# Patient Record
Sex: Female | Born: 1970 | Race: White | Hispanic: No | Marital: Single | Smoking: Never smoker
Health system: Southern US, Community
[De-identification: ages and names within clinical notes are randomized; demographics above are authoritative.]

## PROBLEM LIST (undated history)

## (undated) DIAGNOSIS — D099 Carcinoma in situ, unspecified: Secondary | ICD-10-CM

## (undated) DIAGNOSIS — D6800 Von Willebrand disease, unspecified: Secondary | ICD-10-CM

## (undated) DIAGNOSIS — I059 Rheumatic mitral valve disease, unspecified: Secondary | ICD-10-CM

## (undated) DIAGNOSIS — Z9889 Other specified postprocedural states: Secondary | ICD-10-CM

## (undated) DIAGNOSIS — R002 Palpitations: Secondary | ICD-10-CM

## (undated) DIAGNOSIS — G43909 Migraine, unspecified, not intractable, without status migrainosus: Secondary | ICD-10-CM

## (undated) DIAGNOSIS — K259 Gastric ulcer, unspecified as acute or chronic, without hemorrhage or perforation: Secondary | ICD-10-CM

## (undated) DIAGNOSIS — E559 Vitamin D deficiency, unspecified: Secondary | ICD-10-CM

## (undated) DIAGNOSIS — D6851 Activated protein C resistance: Secondary | ICD-10-CM

## (undated) DIAGNOSIS — Z8489 Family history of other specified conditions: Secondary | ICD-10-CM

## (undated) DIAGNOSIS — I82409 Acute embolism and thrombosis of unspecified deep veins of unspecified lower extremity: Secondary | ICD-10-CM

## (undated) DIAGNOSIS — R12 Heartburn: Secondary | ICD-10-CM

## (undated) DIAGNOSIS — R112 Nausea with vomiting, unspecified: Secondary | ICD-10-CM

## (undated) DIAGNOSIS — N979 Female infertility, unspecified: Secondary | ICD-10-CM

## (undated) DIAGNOSIS — Z780 Asymptomatic menopausal state: Secondary | ICD-10-CM

## (undated) DIAGNOSIS — F419 Anxiety disorder, unspecified: Secondary | ICD-10-CM

## (undated) DIAGNOSIS — Z86718 Personal history of other venous thrombosis and embolism: Secondary | ICD-10-CM

## (undated) DIAGNOSIS — I080 Rheumatic disorders of both mitral and aortic valves: Secondary | ICD-10-CM

## (undated) HISTORY — DX: Rheumatic mitral valve disease, unspecified: I05.9

## (undated) HISTORY — DX: Palpitations: R00.2

## (undated) HISTORY — DX: Activated protein C resistance: D68.51

## (undated) HISTORY — DX: Asymptomatic menopausal state: Z78.0

## (undated) HISTORY — DX: Anxiety disorder, unspecified: F41.9

## (undated) HISTORY — DX: Heartburn: R12

## (undated) HISTORY — DX: Von Willebrand's disease: D68.0

## (undated) HISTORY — DX: Female infertility, unspecified: N97.9

## (undated) HISTORY — PX: MANDIBLE FRACTURE SURGERY: SHX706

## (undated) HISTORY — DX: Migraine, unspecified, not intractable, without status migrainosus: G43.909

## (undated) HISTORY — DX: Vitamin D deficiency, unspecified: E55.9

## (undated) HISTORY — DX: Rheumatic disorders of both mitral and aortic valves: I08.0

## (undated) HISTORY — DX: Carcinoma in situ, unspecified: D09.9

## (undated) HISTORY — DX: Gastric ulcer, unspecified as acute or chronic, without hemorrhage or perforation: K25.9

## (undated) HISTORY — DX: Von Willebrand disease, unspecified: D68.00

## (undated) HISTORY — DX: Acute embolism and thrombosis of unspecified deep veins of unspecified lower extremity: I82.409

---

## 1999-11-07 ENCOUNTER — Encounter: Admission: RE | Admit: 1999-11-07 | Discharge: 1999-11-07 | Payer: Self-pay | Admitting: Oral Surgery

## 1999-11-07 ENCOUNTER — Encounter: Payer: Self-pay | Admitting: Oral Surgery

## 1999-11-10 ENCOUNTER — Ambulatory Visit (HOSPITAL_BASED_OUTPATIENT_CLINIC_OR_DEPARTMENT_OTHER): Admission: RE | Admit: 1999-11-10 | Discharge: 1999-11-11 | Payer: Self-pay | Admitting: Oral Surgery

## 2000-04-02 ENCOUNTER — Other Ambulatory Visit: Admission: RE | Admit: 2000-04-02 | Discharge: 2000-04-02 | Payer: Self-pay | Admitting: Obstetrics and Gynecology

## 2000-06-24 ENCOUNTER — Other Ambulatory Visit: Admission: RE | Admit: 2000-06-24 | Discharge: 2000-06-24 | Payer: Self-pay | Admitting: Obstetrics and Gynecology

## 2002-01-11 ENCOUNTER — Inpatient Hospital Stay (HOSPITAL_COMMUNITY): Admission: AD | Admit: 2002-01-11 | Discharge: 2002-01-13 | Payer: Self-pay | Admitting: Internal Medicine

## 2004-01-18 ENCOUNTER — Other Ambulatory Visit: Admission: RE | Admit: 2004-01-18 | Discharge: 2004-01-18 | Payer: Self-pay | Admitting: Family Medicine

## 2004-12-25 ENCOUNTER — Ambulatory Visit: Payer: Self-pay | Admitting: Cardiology

## 2005-05-20 ENCOUNTER — Ambulatory Visit (HOSPITAL_COMMUNITY): Admission: RE | Admit: 2005-05-20 | Discharge: 2005-05-20 | Payer: Self-pay | Admitting: Family Medicine

## 2005-05-25 ENCOUNTER — Ambulatory Visit: Payer: Self-pay | Admitting: Obstetrics & Gynecology

## 2005-05-25 ENCOUNTER — Encounter (INDEPENDENT_AMBULATORY_CARE_PROVIDER_SITE_OTHER): Payer: Self-pay | Admitting: *Deleted

## 2005-05-25 ENCOUNTER — Ambulatory Visit: Payer: Self-pay | Admitting: Hematology & Oncology

## 2005-05-27 ENCOUNTER — Ambulatory Visit (HOSPITAL_COMMUNITY): Admission: RE | Admit: 2005-05-27 | Discharge: 2005-05-27 | Payer: Self-pay | Admitting: *Deleted

## 2005-06-01 ENCOUNTER — Ambulatory Visit: Payer: Self-pay | Admitting: *Deleted

## 2005-06-01 ENCOUNTER — Inpatient Hospital Stay (HOSPITAL_COMMUNITY): Admission: AD | Admit: 2005-06-01 | Discharge: 2005-06-01 | Payer: Self-pay | Admitting: *Deleted

## 2005-06-05 ENCOUNTER — Inpatient Hospital Stay (HOSPITAL_COMMUNITY): Admission: AD | Admit: 2005-06-05 | Discharge: 2005-06-05 | Payer: Self-pay | Admitting: *Deleted

## 2005-06-24 ENCOUNTER — Ambulatory Visit: Payer: Self-pay | Admitting: Obstetrics & Gynecology

## 2005-06-28 ENCOUNTER — Emergency Department (HOSPITAL_COMMUNITY): Admission: EM | Admit: 2005-06-28 | Discharge: 2005-06-28 | Payer: Self-pay | Admitting: Emergency Medicine

## 2005-08-05 ENCOUNTER — Ambulatory Visit: Payer: Self-pay | Admitting: Hematology & Oncology

## 2005-08-05 LAB — IVY BLEEDING TIME
Bleeding Time: 12 Minutes — ABNORMAL HIGH (ref 2.0–8.0)
Bleeding Time: 7 min (ref 2.0–8.0)

## 2005-08-06 ENCOUNTER — Ambulatory Visit: Payer: Self-pay | Admitting: Family Medicine

## 2005-08-06 ENCOUNTER — Other Ambulatory Visit: Admission: RE | Admit: 2005-08-06 | Discharge: 2005-08-06 | Payer: Self-pay | Admitting: Family Medicine

## 2005-08-06 ENCOUNTER — Encounter (INDEPENDENT_AMBULATORY_CARE_PROVIDER_SITE_OTHER): Payer: Self-pay | Admitting: *Deleted

## 2005-09-09 ENCOUNTER — Ambulatory Visit: Payer: Self-pay | Admitting: Obstetrics & Gynecology

## 2006-02-10 ENCOUNTER — Ambulatory Visit: Payer: Self-pay | Admitting: Cardiology

## 2007-08-31 ENCOUNTER — Other Ambulatory Visit: Admission: RE | Admit: 2007-08-31 | Discharge: 2007-08-31 | Payer: Self-pay | Admitting: Family Medicine

## 2007-10-10 ENCOUNTER — Ambulatory Visit: Payer: Self-pay | Admitting: Cardiology

## 2007-10-11 ENCOUNTER — Encounter: Payer: Self-pay | Admitting: Cardiology

## 2007-10-11 ENCOUNTER — Ambulatory Visit: Payer: Self-pay

## 2008-08-08 ENCOUNTER — Encounter (INDEPENDENT_AMBULATORY_CARE_PROVIDER_SITE_OTHER): Payer: Self-pay | Admitting: *Deleted

## 2009-03-06 DIAGNOSIS — G43909 Migraine, unspecified, not intractable, without status migrainosus: Secondary | ICD-10-CM | POA: Insufficient documentation

## 2009-03-06 DIAGNOSIS — R079 Chest pain, unspecified: Secondary | ICD-10-CM | POA: Insufficient documentation

## 2009-03-06 DIAGNOSIS — Z8679 Personal history of other diseases of the circulatory system: Secondary | ICD-10-CM | POA: Insufficient documentation

## 2009-03-06 DIAGNOSIS — R0602 Shortness of breath: Secondary | ICD-10-CM | POA: Insufficient documentation

## 2009-03-06 DIAGNOSIS — Z87898 Personal history of other specified conditions: Secondary | ICD-10-CM

## 2009-03-06 DIAGNOSIS — I080 Rheumatic disorders of both mitral and aortic valves: Secondary | ICD-10-CM | POA: Insufficient documentation

## 2009-03-06 DIAGNOSIS — I059 Rheumatic mitral valve disease, unspecified: Secondary | ICD-10-CM | POA: Insufficient documentation

## 2009-03-12 ENCOUNTER — Ambulatory Visit: Payer: Self-pay | Admitting: Cardiology

## 2009-03-12 ENCOUNTER — Encounter: Payer: Self-pay | Admitting: Cardiology

## 2010-04-02 ENCOUNTER — Ambulatory Visit
Admission: RE | Admit: 2010-04-02 | Discharge: 2010-04-02 | Payer: Self-pay | Source: Home / Self Care | Attending: Internal Medicine | Admitting: Internal Medicine

## 2010-04-11 ENCOUNTER — Encounter
Admission: RE | Admit: 2010-04-11 | Discharge: 2010-04-11 | Payer: Self-pay | Source: Home / Self Care | Attending: Internal Medicine | Admitting: Internal Medicine

## 2010-04-13 ENCOUNTER — Encounter: Payer: Self-pay | Admitting: *Deleted

## 2010-04-23 ENCOUNTER — Encounter: Payer: Self-pay | Admitting: Internal Medicine

## 2010-04-30 ENCOUNTER — Ambulatory Visit (INDEPENDENT_AMBULATORY_CARE_PROVIDER_SITE_OTHER): Payer: 59 | Admitting: Internal Medicine

## 2010-04-30 DIAGNOSIS — F4323 Adjustment disorder with mixed anxiety and depressed mood: Secondary | ICD-10-CM

## 2010-05-14 ENCOUNTER — Ambulatory Visit (HOSPITAL_COMMUNITY): Payer: 59 | Admitting: Marriage and Family Therapist

## 2010-05-14 DIAGNOSIS — F410 Panic disorder [episodic paroxysmal anxiety] without agoraphobia: Secondary | ICD-10-CM

## 2010-05-21 ENCOUNTER — Encounter (HOSPITAL_COMMUNITY): Payer: 59 | Admitting: Marriage and Family Therapist

## 2010-05-21 DIAGNOSIS — F410 Panic disorder [episodic paroxysmal anxiety] without agoraphobia: Secondary | ICD-10-CM

## 2010-05-28 ENCOUNTER — Encounter (HOSPITAL_COMMUNITY): Payer: 59 | Admitting: Marriage and Family Therapist

## 2010-05-28 DIAGNOSIS — F339 Major depressive disorder, recurrent, unspecified: Secondary | ICD-10-CM

## 2010-05-29 ENCOUNTER — Ambulatory Visit (INDEPENDENT_AMBULATORY_CARE_PROVIDER_SITE_OTHER): Payer: 59 | Admitting: Internal Medicine

## 2010-05-29 DIAGNOSIS — F909 Attention-deficit hyperactivity disorder, unspecified type: Secondary | ICD-10-CM

## 2010-05-29 DIAGNOSIS — R1084 Generalized abdominal pain: Secondary | ICD-10-CM

## 2010-06-04 ENCOUNTER — Encounter (HOSPITAL_COMMUNITY): Payer: 59 | Admitting: Marriage and Family Therapist

## 2010-06-04 DIAGNOSIS — F410 Panic disorder [episodic paroxysmal anxiety] without agoraphobia: Secondary | ICD-10-CM

## 2010-06-04 DIAGNOSIS — F339 Major depressive disorder, recurrent, unspecified: Secondary | ICD-10-CM

## 2010-06-09 ENCOUNTER — Encounter (HOSPITAL_COMMUNITY): Payer: 59 | Admitting: Marriage and Family Therapist

## 2010-06-09 DIAGNOSIS — F339 Major depressive disorder, recurrent, unspecified: Secondary | ICD-10-CM

## 2010-06-09 DIAGNOSIS — F410 Panic disorder [episodic paroxysmal anxiety] without agoraphobia: Secondary | ICD-10-CM

## 2010-06-10 ENCOUNTER — Encounter: Payer: Self-pay | Admitting: Cardiology

## 2010-06-11 ENCOUNTER — Encounter (HOSPITAL_COMMUNITY): Payer: 59 | Admitting: Marriage and Family Therapist

## 2010-06-12 ENCOUNTER — Ambulatory Visit: Payer: 59 | Admitting: Internal Medicine

## 2010-06-18 ENCOUNTER — Encounter (HOSPITAL_COMMUNITY): Payer: 59 | Admitting: Marriage and Family Therapist

## 2010-06-19 ENCOUNTER — Ambulatory Visit: Payer: 59 | Admitting: Internal Medicine

## 2010-06-19 DIAGNOSIS — R4589 Other symptoms and signs involving emotional state: Secondary | ICD-10-CM

## 2010-06-20 ENCOUNTER — Encounter: Payer: Self-pay | Admitting: Cardiology

## 2010-06-20 ENCOUNTER — Ambulatory Visit (INDEPENDENT_AMBULATORY_CARE_PROVIDER_SITE_OTHER): Payer: 59 | Admitting: Cardiology

## 2010-06-20 VITALS — BP 110/68 | HR 68 | Resp 16 | Ht 70.0 in | Wt 170.0 lb

## 2010-06-20 DIAGNOSIS — I059 Rheumatic mitral valve disease, unspecified: Secondary | ICD-10-CM

## 2010-06-20 NOTE — Patient Instructions (Signed)
Your physician recommends that you schedule a follow-up appointment in:  In 2 years with Dr. Daleen Squibb

## 2010-06-20 NOTE — Progress Notes (Signed)
   Patient ID: JENEAL VOGL, female    DOB: [birth year], 40 y.o.   MRN: [id number]  HPI  Ms Reilley Valentine returns for E and M of her MVP. She has no complaints including no palpitations, CP, or syncope. She is on no cardiac meds. EKG is normal today.    Review of Systems  [all other systems reviewed and are negative      Physical Exam  Constitutional: She is oriented to person, place, and time. She appears well-developed and well-nourished. No distress.  HENT:  Head: Normocephalic and atraumatic.  Eyes: EOM are normal. Pupils are equal, round, and reactive to light.  Neck: Normal range of motion. Neck supple. No JVD present. No tracheal deviation present. No thyromegaly present.  Cardiovascular: Normal rate, regular rhythm, normal heart sounds and intact distal pulses.   No murmur heard.      No definite click.  Pulmonary/Chest: Effort normal and breath sounds normal.  Abdominal: Soft. Bowel sounds are normal.  Musculoskeletal: Normal range of motion. She exhibits no edema.  Neurological: She is alert and oriented to person, place, and time.  Skin: Skin is warm and dry.  Psychiatric: She has a normal mood and affect.

## 2010-07-02 ENCOUNTER — Ambulatory Visit: Payer: 59 | Admitting: Internal Medicine

## 2010-07-03 ENCOUNTER — Encounter (HOSPITAL_COMMUNITY): Payer: 59 | Admitting: Marriage and Family Therapist

## 2010-07-03 DIAGNOSIS — F339 Major depressive disorder, recurrent, unspecified: Secondary | ICD-10-CM

## 2010-07-03 DIAGNOSIS — F410 Panic disorder [episodic paroxysmal anxiety] without agoraphobia: Secondary | ICD-10-CM

## 2010-07-08 ENCOUNTER — Encounter (HOSPITAL_COMMUNITY): Payer: 59 | Admitting: Marriage and Family Therapist

## 2010-07-16 ENCOUNTER — Encounter (HOSPITAL_COMMUNITY): Payer: 59 | Admitting: Marriage and Family Therapist

## 2010-07-16 DIAGNOSIS — F410 Panic disorder [episodic paroxysmal anxiety] without agoraphobia: Secondary | ICD-10-CM

## 2010-07-16 DIAGNOSIS — F339 Major depressive disorder, recurrent, unspecified: Secondary | ICD-10-CM

## 2010-07-23 ENCOUNTER — Encounter (HOSPITAL_COMMUNITY): Payer: 59 | Admitting: Marriage and Family Therapist

## 2010-07-23 DIAGNOSIS — F410 Panic disorder [episodic paroxysmal anxiety] without agoraphobia: Secondary | ICD-10-CM

## 2010-07-23 DIAGNOSIS — F339 Major depressive disorder, recurrent, unspecified: Secondary | ICD-10-CM

## 2010-07-30 ENCOUNTER — Encounter (HOSPITAL_COMMUNITY): Payer: 59 | Admitting: Marriage and Family Therapist

## 2010-07-30 DIAGNOSIS — F410 Panic disorder [episodic paroxysmal anxiety] without agoraphobia: Secondary | ICD-10-CM

## 2010-07-30 DIAGNOSIS — F339 Major depressive disorder, recurrent, unspecified: Secondary | ICD-10-CM

## 2010-08-05 NOTE — Assessment & Plan Note (Signed)
[facility]                            CARDIOLOGY OFFICE NOTE   NAME:Pamela [name], Pamela [name]                    MRN:          [id number]  DATE:10/10/2007                            DOB:          09-12-1970    Ms. Pamela [name] returns today for further management of her mitral valve  prolapse and history of palpitations.  It has been several years since  we have seen here.   She has been hiking quite a bit at [employer] and has lost about 18  pounds.  She looks remarkably good.  She denies any palpitations and has  had some mild shortness of breath and lightheadedness with exertion.   She says she stays well hydrated.  She denies any chest pain.  She had  no orthopnea, PND, syncope, or presyncope.   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   She is on no medications.   PHYSICAL EXAMINATION:  VITAL SIGNS:  Today, her blood pressure is  114/78, her pulse is 68 and regular, and her weight is 180.  HEENT:  Normocephalic and atraumatic.  PERRL.  Extraocular movements are  intact.  Sclerae are clear.  Facial symmetry is normal.  NECK:  Carotid upstrokes are equal bilaterally without bruits.  No JVD.  Thyroid is not palpable.  Trachea is midline.  LUNGS:  Clear.  HEART:  A nondisplaced, poorly appreciated PMI.  She has normal S1 and  S2.  There is a very soft murmur at the apex.  No click.  ABDOMEN:  Soft.  Good bowel sounds.  No midline bruits.  EXTREMITIES:  There is no cyanosis, clubbing, or edema.  Pulses are  intact.  NEURO:  Intact.   Electrocardiogram demonstrates sinus rhythm with nonspecific ST-segment  changes.   ASSESSMENT:  1. Mitral valve prolapse with a history of mild mitral regurgitation.  2. Tachy palpitations, which are fairly quiescent.  3. Exertional shortness of breath and lightheadedness.   PLAN:  1. A 2-D echocardiogram to reassess LV function, degree of mitral      valve prolapse, mitral  regurgitation, and right-sided pressures.  2. Continue weight reduction.  3. Stay well hydrated.  4. No subacute bacterial endocarditis prophylaxis recommended.   I will plan on seeing her back again in a year.     Thomas C. Daleen Squibb, MD, [facility]  Electronically Signed    TCW/MedQ  DD: 10/10/2007  DT: 10/11/2007  Job #: [id number]

## 2010-08-06 ENCOUNTER — Encounter: Payer: Self-pay | Admitting: Internal Medicine

## 2010-08-07 ENCOUNTER — Ambulatory Visit (INDEPENDENT_AMBULATORY_CARE_PROVIDER_SITE_OTHER): Payer: 59 | Admitting: Internal Medicine

## 2010-08-07 DIAGNOSIS — F4323 Adjustment disorder with mixed anxiety and depressed mood: Secondary | ICD-10-CM

## 2010-08-07 DIAGNOSIS — R51 Headache: Secondary | ICD-10-CM

## 2010-08-08 NOTE — Discharge Summary (Signed)
   NAME:  KOLEEN, CELIA                      ACCOUNT NO.:  [id number]   MEDICAL RECORD NO.:  [id number]                   PATIENT TYPE:  INP   LOCATION:  0374                                 FACILITY:  [facility]   PHYSICIAN:  Sherin Quarry, MD                   DATE OF BIRTH:  03/13/1971   DATE OF ADMISSION:  01/11/2002  DATE OF DISCHARGE:  01/13/2002                                 DISCHARGE SUMMARY   HISTORY OF PRESENT ILLNESS:  The patient is a 40 year old lady who began  birth control pills in May.  She states that she began to experience pain in  her calf about five days prior to admission on 01/11/02.  She also noted the  onset of an area of linear redness on the calf.  She saw Dr. Janey Greaser in the  walk-in clinic on the night prior to admission, and concern was raised about  possible deep vein thrombosis.  She was given 100 mg of Lovenox x1 in the  walk-in clinic and then was sent for a venous Doppler at [facility] which showed a deep vein thrombosis in the patellar and calf veins.  She was advised to come to the hospital for treatment.   PHYSICAL EXAMINATION:  Remarkable only for examination of her right calf  which showed erythema and linear streak posteriorly in the calf.   HOSPITAL COURSE:  On admission, she was begun on Lovenox at a dose of 70 mg  subcutaneously q.12h., and a weight based protocol.  She was also given  Coumadin 10 mg initially, and then 10 mg the next day, and 7.5 mg the next  day.  Her INR was 1.5 at the time of her discharge.  Her mother was  instructed in administration of Lovenox.  On 01/13/02, was felt reasonable  to discharge the patient.   DISCHARGE DIAGNOSIS:  Deep vein thrombophlebitis.   DISCHARGE MEDICATIONS:  1. Lovenox 60 mg subcutaneously q.12h. Friday, Saturday, and Sunday.  2. Coumadin 7.5 mg Friday, 7.5 mg Saturday, and 5 mg on Sunday.  3. Darvocet p.r.n. pain.   FOLLOWUP:  1. She was instructed to go to [facility] on Monday     for followup prothrombin time and INR.  2. She was advised to have a return appointment with Dr. Doran Clay in     five to seven days.                                               Sherin Quarry, MD    SY/MEDQ  D:  01/13/2002  T:  01/15/2002  Job:  [id number]   cc:   Al Decant. Janey Greaser, M.D.  [address]  [place]  [place] [address]  Fax: [phone]

## 2010-08-08 NOTE — Group Therapy Note (Signed)
NAME:  [name], Pamela             ACCOUNT NO.:  [id number]   MEDICAL RECORD NO.:  [id number]          PATIENT TYPE:  WOC   LOCATION:  [facility]                   FACILITY:  [facility]   PHYSICIAN:  Elsie Lincoln, MD      DATE OF BIRTH:  05-30-1970   DATE OF SERVICE:  06/24/2005                                    CLINIC NOTE   The patient is a 40 year old female who presents to followup miscarriage.  She had an early first trimester miscarriage spontaneously and has stopped  bleeding since then. She has occasional spotting but nothing heavy. She is  no longer cramping. She was seen in our high risk clinic secondary to factor  V Leiden positive but also I sent her to a hematologist because she has a  history of bleeding heavily in oral surgery. They did do a bleeding time  which was 12 minutes and they drew more labs which I am understand is a  workup for Von Willebrand disease. She has not had the results of those back  but she will get them this week and we will hopefully receive a copy of the  consult. 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 She needs a colposcopy. She is no longer sexually  active as she is getting divorced from her husband. She is simply doing well  with no signs of depression. We will follow her up with colposcopy in 6-8  weeks after getting the note from the hematologist to make sure that she  does not need any desmopressin prior to the procedure.           ______________________________  Elsie Lincoln, MD     KL/MEDQ  D:  06/24/2005  T:  06/25/2005  Job:  [id number]

## 2010-08-14 ENCOUNTER — Ambulatory Visit: Payer: 59 | Admitting: Internal Medicine

## 2010-08-20 ENCOUNTER — Encounter (HOSPITAL_COMMUNITY): Payer: 59 | Admitting: Marriage and Family Therapist

## 2010-08-20 DIAGNOSIS — F410 Panic disorder [episodic paroxysmal anxiety] without agoraphobia: Secondary | ICD-10-CM

## 2010-08-20 DIAGNOSIS — F339 Major depressive disorder, recurrent, unspecified: Secondary | ICD-10-CM

## 2010-08-27 ENCOUNTER — Encounter (HOSPITAL_COMMUNITY): Payer: 59 | Admitting: Marriage and Family Therapist

## 2010-09-03 ENCOUNTER — Encounter (HOSPITAL_COMMUNITY): Payer: 59 | Admitting: Marriage and Family Therapist

## 2010-09-10 ENCOUNTER — Encounter (HOSPITAL_COMMUNITY): Payer: 59 | Admitting: Marriage and Family Therapist

## 2010-09-10 DIAGNOSIS — F339 Major depressive disorder, recurrent, unspecified: Secondary | ICD-10-CM

## 2010-09-10 DIAGNOSIS — F410 Panic disorder [episodic paroxysmal anxiety] without agoraphobia: Secondary | ICD-10-CM

## 2010-09-29 ENCOUNTER — Encounter (HOSPITAL_COMMUNITY): Payer: 59 | Admitting: Marriage and Family Therapist

## 2010-09-29 DIAGNOSIS — F410 Panic disorder [episodic paroxysmal anxiety] without agoraphobia: Secondary | ICD-10-CM

## 2010-09-29 DIAGNOSIS — F329 Major depressive disorder, single episode, unspecified: Secondary | ICD-10-CM

## 2010-10-07 ENCOUNTER — Encounter (HOSPITAL_COMMUNITY): Payer: 59 | Admitting: Marriage and Family Therapist

## 2010-10-13 ENCOUNTER — Encounter (HOSPITAL_COMMUNITY): Payer: 59 | Admitting: Marriage and Family Therapist

## 2010-10-20 ENCOUNTER — Ambulatory Visit (INDEPENDENT_AMBULATORY_CARE_PROVIDER_SITE_OTHER): Payer: 59 | Admitting: Internal Medicine

## 2010-10-20 ENCOUNTER — Encounter: Payer: Self-pay | Admitting: Internal Medicine

## 2010-10-20 VITALS — BP 116/72 | HR 75 | Temp 97.5°F | Ht 69.5 in | Wt 175.0 lb

## 2010-10-20 DIAGNOSIS — I341 Nonrheumatic mitral (valve) prolapse: Secondary | ICD-10-CM

## 2010-10-20 DIAGNOSIS — I824Z9 Acute embolism and thrombosis of unspecified deep veins of unspecified distal lower extremity: Secondary | ICD-10-CM

## 2010-10-20 DIAGNOSIS — Z111 Encounter for screening for respiratory tuberculosis: Secondary | ICD-10-CM

## 2010-10-20 DIAGNOSIS — D6851 Activated protein C resistance: Secondary | ICD-10-CM | POA: Insufficient documentation

## 2010-10-20 DIAGNOSIS — I82409 Acute embolism and thrombosis of unspecified deep veins of unspecified lower extremity: Secondary | ICD-10-CM | POA: Insufficient documentation

## 2010-10-20 DIAGNOSIS — G43909 Migraine, unspecified, not intractable, without status migrainosus: Secondary | ICD-10-CM | POA: Insufficient documentation

## 2010-10-20 DIAGNOSIS — F4322 Adjustment disorder with anxiety: Secondary | ICD-10-CM

## 2010-10-20 DIAGNOSIS — D680 Von Willebrand's disease: Secondary | ICD-10-CM | POA: Insufficient documentation

## 2010-10-20 DIAGNOSIS — I059 Rheumatic mitral valve disease, unspecified: Secondary | ICD-10-CM

## 2010-10-20 DIAGNOSIS — D6859 Other primary thrombophilia: Secondary | ICD-10-CM

## 2010-10-20 NOTE — Patient Instructions (Signed)
Next visit with Dr. Daleen Squibb discuss with him possiblility of ECHO  Call as needed

## 2010-10-20 NOTE — Progress Notes (Signed)
Subjective:    Patient ID: Pamela [name], female    DOB: 05-08-1970, 40 y.o.   MRN: [id number]  HPI  Pamela [name] is here to follow up on her adjustment disorder with anxiety and for TB skin test.    She is doing well.  She is contemplating adopting a foster child.  She has considered this for quite some time now and has already initiated classes with an agency.  She has been a guardian ad litem in the past and is aware of some of the responsibilities of raising a foster child .  She wishes to have a teen-age boy and has met some of the children already.  Still has some job stress over continuing her current job.  Her anxiety is much improved and she is off all  meds now.  She did see Dr. Donell Beers and has reduced her appointments with [facility] her therapist.  Appetite good.  Sleeping much improved.  She has not had any chest pain or palpitations  No Known Allergies Past Medical History  Diagnosis Date  . Mitral valve disorders     with regurgitation  . Mitral valve insufficiency and aortic valve insufficiency   . Palpitations   . Migraine headache   . Factor V Leiden   . DVT (deep venous thrombosis)   . Von Willebrand disease   . Anxiety     Adjustment disorder with anxious/depressed mood   Past Surgical History  Procedure Date  . Mandible fracture surgery    History   Social History  . Marital Status: Legally Separated    Spouse Name: N/A    Number of Children: N/A  . Years of Education: college   Occupational History  . ACCOUNTANT    Social History Main Topics  . Smoking status: Never Smoker   . Smokeless tobacco: Never Used  . Alcohol Use: 1.0 - 1.5 oz/week    2-3 drink(s) per week  . Drug Use: No  . Sexually Active: Yes -- Female partner(s)   Other Topics Concern  . Not on file   Social History Narrative  . No narrative on file  S Family History  Problem Relation Age of Onset  . Mitral valve prolapse Mother   . Heart disease Mother   . Mitral valve prolapse  Maternal Grandfather   . Stroke Maternal Grandfather   . Cancer Maternal Grandmother     lung   Patient Active Problem List  Diagnoses  . MITRAL REGURGITATION, MILD  . MITRAL VALVE PROLAPSE  . PALPITATIONS, HX OF  . MIGRAINES, HX OF  . Factor V Leiden  . DVT (deep venous thrombosis)  . Migraine headache  . Von Willebrand disease   Current Outpatient Prescriptions on File Prior to Visit  Medication Sig Dispense Refill  . buPROPion (WELLBUTRIN SR) 150 MG 12 hr tablet Take 150 mg by mouth 2 (two) times daily.        . clonazePAM (KLONOPIN) 0.5 MG tablet Take 0.25 mg by mouth daily as needed.             Review of Systems See HPI     Objective:   Physical Exam Physical Exam  Nursing note and vitals reviewed.  Constitutional: She is oriented to person, place, and time. She appears well-developed and well-nourished.  HENT:  Head: Normocephalic and atraumatic.  Cardiovascular: Normal rate and regular rhythm. Exam reveals no gallop and no friction rub.  No murmur heard.  No distinct click is heard Pulmonary/Chest: Breath sounds  normal. She has no wheezes. She has no rales.  Neurological: She is alert and oriented to person, place, and time.  Skin: Skin is warm and dry.  Psychiatric: She has a normal mood and affect. Her behavior is normal.             Assessment & Plan:   1)  Adjustment disorder with anxiety -  Much improved off meds now.  Condition stable  2)  Need for TB skin test  Will administer today and to be read on Thursday. Form filled out for foster adoption  3)  H/O MVP with regurg.  See Dr. Vern Claude last note.  Visits decreased to q2years.  Advised pt to discuss evaluation with repeat ECHO at her next visit with Dr. Daleen Squibb

## 2011-01-12 ENCOUNTER — Encounter: Payer: Self-pay | Admitting: Internal Medicine

## 2011-01-12 ENCOUNTER — Ambulatory Visit (INDEPENDENT_AMBULATORY_CARE_PROVIDER_SITE_OTHER): Payer: 59 | Admitting: Internal Medicine

## 2011-01-12 VITALS — BP 116/76 | HR 78 | Temp 97.1°F | Resp 16 | Ht 70.0 in | Wt 180.0 lb

## 2011-01-12 DIAGNOSIS — J029 Acute pharyngitis, unspecified: Secondary | ICD-10-CM

## 2011-01-12 MED ORDER — AZITHROMYCIN 250 MG PO TABS: ORAL_TABLET | ORAL | Status: AC

## 2011-01-12 NOTE — Progress Notes (Signed)
Subjective:    Patient ID: Pamela [name], female    DOB: 01-23-1971, 40 y.o.   MRN: [id number]  HPI Pamela [name] presents with severe sore throat, no cough or chest pain, no docmented fever.  Very hard to swallow last night.  No headache or neck stiffness No Known Allergies Past Medical History  Diagnosis Date  . Mitral valve disorders     with regurgitation  . Mitral valve insufficiency and aortic valve insufficiency   . Palpitations   . Migraine headache   . Factor V Leiden   . DVT (deep venous thrombosis)   . Von Willebrand disease   . Anxiety     Adjustment disorder with anxious/depressed mood   Past Surgical History  Procedure Date  . Mandible fracture surgery    History   Social History  . Marital Status: Legally Separated    Spouse Name: N/A    Number of Children: N/A  . Years of Education: college   Occupational History  . ACCOUNTANT    Social History Main Topics  . Smoking status: Never Smoker   . Smokeless tobacco: Never Used  . Alcohol Use: 1.0 - 1.5 oz/week    2-3 drink(s) per week  . Drug Use: No  . Sexually Active: Yes -- Female partner(s)   Other Topics Concern  . Not on file   Social History Narrative  . No narrative on file   Family History  Problem Relation Age of Onset  . Mitral valve prolapse Mother   . Heart disease Mother   . Mitral valve prolapse Maternal Grandfather   . Stroke Maternal Grandfather   . Cancer Maternal Grandmother     lung   Patient Active Problem List  Diagnoses  . MITRAL REGURGITATION, MILD  . MITRAL VALVE PROLAPSE  . PALPITATIONS, HX OF  . MIGRAINES, HX OF  . Factor V Leiden  . DVT (deep venous thrombosis)  . Migraine headache  . Von Willebrand disease   Current Outpatient Prescriptions on File Prior to Visit  Medication Sig Dispense Refill  . buPROPion (WELLBUTRIN SR) 150 MG 12 hr tablet Take 150 mg by mouth 2 (two) times daily.        . clonazePAM (KLONOPIN) 0.5 MG tablet Take 0.25 mg by mouth daily as  needed.            Review of Systems\ see hpi     Objective:   Physical Exam Physical Exam  Constitutional: She is oriented to person, place, and time. She appears well-developed and well-nourished. She is cooperative.  HENT:  Head: Normocephalic and atraumatic.  Right Ear: A middle ear effusion is present.  Left Ear: A middle ear effusion is present.  Nose: Mucosal edema present.  Mouth/Throat: Oropharyngeal exudate and posterior oropharyngeal erythema present.  Serous effusion bilaterally  Eyes: Conjunctivae and EOM are normal. Pupils are equal, round, and reactive to light.  Neck: Neck supple. Carotid bruit is not present. No mass present.  Cardiovascular: Regular rhythm, normal heart sounds, intact distal pulses and normal pulses. Exam reveals no gallop and no friction rub.  No murmur heard.  Pulmonary/Chest: Breath sounds normal. She has no wheezes. She has no rhonchi. She has no rales.  Lymphadenopathy:  She has cervical adenopathy.  Neurological: She is alert and oriented to person, place, and time.  Skin: Skin is warm and dry. No abrasion, no bruising, no ecchymosis and no rash noted. No cyanosis. Nails show no clubbing.  Psychiatric: She has a normal mood and  affect. Her speech is normal and behavior is normal.            Assessment & Plan:  Pharyngitis  Will give Zpack OTC Cepacol spray or lozenge prn.  If not better in 10 days pt counseled to call and will tes tfor Mono

## 2011-01-12 NOTE — Patient Instructions (Signed)
Take med as prescribed  Call ifnot better in 10 days

## 2011-03-11 ENCOUNTER — Ambulatory Visit
Admission: RE | Admit: 2011-03-11 | Discharge: 2011-03-11 | Disposition: A | Discharge status: Home / Self Care | Payer: 59 | Source: Ambulatory Visit | Attending: Emergency Medicine | Admitting: Emergency Medicine

## 2011-03-11 ENCOUNTER — Ambulatory Visit (INDEPENDENT_AMBULATORY_CARE_PROVIDER_SITE_OTHER): Payer: 59

## 2011-03-11 ENCOUNTER — Telehealth: Payer: Self-pay | Admitting: Emergency Medicine

## 2011-03-11 ENCOUNTER — Other Ambulatory Visit: Payer: Self-pay | Admitting: Emergency Medicine

## 2011-03-11 DIAGNOSIS — S0990XA Unspecified injury of head, initial encounter: Secondary | ICD-10-CM

## 2011-03-11 DIAGNOSIS — S069X0A Unspecified intracranial injury without loss of consciousness, initial encounter: Secondary | ICD-10-CM

## 2011-03-11 DIAGNOSIS — J019 Acute sinusitis, unspecified: Secondary | ICD-10-CM

## 2011-03-11 NOTE — Telephone Encounter (Signed)
Spoke with Pamela [name] this morning.  She c/o on going sore throat which waxes and wanes with intensity.  She states that at the worst it is a 7 on 1-10 scale, but at times it is minimal.  Has been going on since she was in the office last, but has had many personal stressors and has not taken the time to come to the doctor.  She also notes that she bumped her head on the bunk beds 2 nights ago and has had a headache unlike any headache she has had before.  She states that it is a throbbing headache when she moves, makes her nauseated.  She states she "just feel like something is not right".  I advised her DDS is OOO until 12/27, but I highly recommend she seeks care today at an urgent care.  Advised she go to [facility] or Urgent Care in [place].  She is agreeable and states she will go today

## 2011-07-20 ENCOUNTER — Ambulatory Visit: Payer: 59 | Admitting: Internal Medicine

## 2011-08-11 ENCOUNTER — Other Ambulatory Visit: Payer: Self-pay | Admitting: Internal Medicine

## 2011-08-11 ENCOUNTER — Encounter: Payer: Self-pay | Admitting: Internal Medicine

## 2011-08-11 ENCOUNTER — Ambulatory Visit (INDEPENDENT_AMBULATORY_CARE_PROVIDER_SITE_OTHER): Payer: 59 | Admitting: Internal Medicine

## 2011-08-11 VITALS — BP 110/80 | HR 72 | Temp 98.3°F

## 2011-08-11 DIAGNOSIS — R300 Dysuria: Secondary | ICD-10-CM

## 2011-08-11 DIAGNOSIS — R319 Hematuria, unspecified: Secondary | ICD-10-CM

## 2011-08-11 LAB — POCT URINALYSIS DIPSTICK
Ketones, UA: NEGATIVE
Spec Grav, UA: 1.025
pH, UA: 6

## 2011-08-11 MED ORDER — CIPROFLOXACIN HCL 500 MG PO TABS: 500.0000 mg | ORAL_TABLET | Freq: Two times a day (BID) | ORAL | Status: AC

## 2011-08-11 NOTE — Progress Notes (Signed)
States thinks has UTI- had pain with urination, urinary frequency- took AZO since last Friday, Saturday , Sunday and 1 on Monday,  but not today. States seems to be going away.

## 2011-08-11 NOTE — Patient Instructions (Signed)
Take antibiotic for 5 days  Call if not better

## 2011-08-11 NOTE — Progress Notes (Signed)
  Subjective:    Patient ID: Pamela [name], female    DOB: 1970/07/11, 41 y.o.   MRN: [id number]  HPI  Several days of dysuria, urgency.  See U/A    No fever no flank pain.  Pain in suprapubic area  No Known Allergies Past Medical History  Diagnosis Date  . Mitral valve disorders     with regurgitation  . Mitral valve insufficiency and aortic valve insufficiency   . Palpitations   . Migraine headache   . Factor V Leiden   . DVT (deep venous thrombosis)   . Von Willebrand disease   . Anxiety     Adjustment disorder with anxious/depressed mood   Past Surgical History  Procedure Date  . Mandible fracture surgery    History   Social History  . Marital Status: Legally Separated    Spouse Name: N/A    Number of Children: N/A  . Years of Education: college   Occupational History  . ACCOUNTANT    Social History Main Topics  . Smoking status: Never Smoker   . Smokeless tobacco: Never Used  . Alcohol Use: 1.0 - 1.5 oz/week    2-3 drink(s) per week  . Drug Use: No  . Sexually Active: Yes -- Female partner(s)   Other Topics Concern  . Not on file   Social History Narrative  . No narrative on file   Family History  Problem Relation Age of Onset  . Mitral valve prolapse Mother   . Heart disease Mother   . Mitral valve prolapse Maternal Grandfather   . Stroke Maternal Grandfather   . Cancer Maternal Grandmother     lung   Patient Active Problem List  Diagnoses  . MITRAL REGURGITATION, MILD  . MITRAL VALVE PROLAPSE  . PALPITATIONS, HX OF  . MIGRAINES, HX OF  . Factor V Leiden  . DVT (deep venous thrombosis)  . Migraine headache  . Von Willebrand disease   Current Outpatient Prescriptions on File Prior to Visit  Medication Sig Dispense Refill  . Multiple Vitamin (MULTIVITAMIN) tablet Take 1 tablet by mouth daily.        Marland Kitchen buPROPion (WELLBUTRIN SR) 150 MG 12 hr tablet Take 150 mg by mouth 2 (two) times daily.        . clonazePAM (KLONOPIN) 0.5 MG tablet Take  0.25 mg by mouth daily as needed.            Review of Systems    see HPI Objective:   Physical Exam Physical Exam  Nursing note and vitals reviewed.  Constitutional: She is oriented to person, place, and time. She appears well-developed and well-nourished.  HENT:  Head: Normocephalic and atraumatic.  Cardiovascular: Normal rate and regular rhythm. Exam reveals no gallop and no friction rub.  No murmur heard.  Pulmonary/Chest: Breath sounds normal. She has no wheezes. She has no rales.  Abd:  No CVA tenderness.  No preritoneal signs.  No rebound.  Pos suprapubc tenderness  Neurological: She is alert and oriented to person, place, and time.  Skin: Skin is warm and dry.  Psychiatric: She has a normal mood and affect. Her behavior is normal.             Assessment & Plan:  UTI  With symptoms still present will treat with 5 day course of cipro  Hematuria  Dysuria

## 2011-08-15 LAB — URINE CULTURE: Colony Count: >=100000

## 2011-08-18 NOTE — Progress Notes (Signed)
Pt notified of the results of her urine culture and sensitivity.

## 2011-09-10 ENCOUNTER — Ambulatory Visit (HOSPITAL_BASED_OUTPATIENT_CLINIC_OR_DEPARTMENT_OTHER)
Admission: RE | Admit: 2011-09-10 | Discharge: 2011-09-10 | Disposition: A | Discharge status: Home / Self Care | Payer: 59 | Source: Ambulatory Visit | Attending: Internal Medicine | Admitting: Internal Medicine

## 2011-09-10 ENCOUNTER — Other Ambulatory Visit: Payer: Self-pay | Admitting: Internal Medicine

## 2011-09-10 ENCOUNTER — Ambulatory Visit (INDEPENDENT_AMBULATORY_CARE_PROVIDER_SITE_OTHER): Payer: 59 | Admitting: Internal Medicine

## 2011-09-10 ENCOUNTER — Encounter: Payer: Self-pay | Admitting: Internal Medicine

## 2011-09-10 VITALS — BP 120/82 | HR 69 | Temp 97.7°F | Resp 16 | Ht 70.0 in | Wt 177.0 lb

## 2011-09-10 DIAGNOSIS — Z1231 Encounter for screening mammogram for malignant neoplasm of breast: Secondary | ICD-10-CM

## 2011-09-10 DIAGNOSIS — Z0000 Encounter for general adult medical examination without abnormal findings: Secondary | ICD-10-CM

## 2011-09-10 DIAGNOSIS — F411 Generalized anxiety disorder: Secondary | ICD-10-CM

## 2011-09-10 DIAGNOSIS — Z8679 Personal history of other diseases of the circulatory system: Secondary | ICD-10-CM

## 2011-09-10 DIAGNOSIS — Z124 Encounter for screening for malignant neoplasm of cervix: Secondary | ICD-10-CM

## 2011-09-10 DIAGNOSIS — Z113 Encounter for screening for infections with a predominantly sexual mode of transmission: Secondary | ICD-10-CM

## 2011-09-10 DIAGNOSIS — F419 Anxiety disorder, unspecified: Secondary | ICD-10-CM

## 2011-09-10 DIAGNOSIS — D6851 Activated protein C resistance: Secondary | ICD-10-CM

## 2011-09-10 DIAGNOSIS — D6859 Other primary thrombophilia: Secondary | ICD-10-CM

## 2011-09-10 DIAGNOSIS — Z23 Encounter for immunization: Secondary | ICD-10-CM

## 2011-09-10 DIAGNOSIS — Z1151 Encounter for screening for human papillomavirus (HPV): Secondary | ICD-10-CM

## 2011-09-10 DIAGNOSIS — I059 Rheumatic mitral valve disease, unspecified: Secondary | ICD-10-CM

## 2011-09-10 LAB — POCT URINALYSIS DIPSTICK
Glucose, UA: NEGATIVE
Nitrite, UA: NEGATIVE
Protein, UA: NEGATIVE
Spec Grav, UA: 1.02
Urobilinogen, UA: NEGATIVE
pH, UA: 6

## 2011-09-10 LAB — COMPLETE METABOLIC PANEL WITH GFR
AST: 15 U/L (ref 0–37)
Alkaline Phosphatase: 60 U/L (ref 39–117)
BUN: 10 mg/dL (ref 6–23)
Creat: 0.74 mg/dL (ref 0.50–1.10)
GFR, Est Non African American: >89 mL/min
Glucose, Bld: 82 mg/dL (ref 70–99)
Potassium: 3.9 mEq/L (ref 3.5–5.3)
Total Bilirubin: 0.8 mg/dL (ref 0.3–1.2)

## 2011-09-10 LAB — CBC WITH DIFFERENTIAL/PLATELET
Basophils Relative: 1 % (ref 0–1)
HCT: 36.5 % (ref 36.0–46.0)
Hemoglobin: 12.4 g/dL (ref 12.0–15.0)
Lymphocytes Relative: 22 % (ref 12–46)
MCHC: 34 g/dL (ref 30.0–36.0)
Monocytes Absolute: 0.3 10*3/uL (ref 0.1–1.0)
Monocytes Relative: 7 % (ref 3–12)
Neutro Abs: 3 10*3/uL (ref 1.7–7.7)
Neutrophils Relative %: 69 % (ref 43–77)
RBC: 4.36 MIL/uL (ref 3.87–5.11)
WBC: 4.3 10*3/uL (ref 4.0–10.5)

## 2011-09-10 LAB — LIPID PANEL
Cholesterol: 122 mg/dL (ref 0–200)
HDL: 49 mg/dL (ref >39)
LDL Cholesterol: 61 mg/dL (ref 0–99)
Triglycerides: 61 mg/dL (ref <150)
VLDL: 12 mg/dL (ref 0–40)

## 2011-09-10 LAB — TSH: TSH: 1.05 u[IU]/mL (ref 0.350–4.500)

## 2011-09-10 MED ORDER — CLONAZEPAM 0.5 MG PO TABS: ORAL_TABLET | ORAL | Status: DC

## 2011-09-10 MED ORDER — TETANUS-DIPHTH-ACELL PERTUSSIS 5-2.5-18.5 LF-MCG/0.5 IM SUSP: 0.5000 mL | Freq: Once | INTRAMUSCULAR | Status: DC

## 2011-09-10 NOTE — Patient Instructions (Addendum)
Labs will be mailed to you  Mammogram today

## 2011-09-10 NOTE — Progress Notes (Signed)
Subjective:    Patient ID: Pamela [name], female    DOB: 18-Jun-1970, 41 y.o.   MRN: [id number]  HPI  Pamela [name] is here for comprehensive eval.  Overall doing well but last 2 weeks her anxiety has returned.  She has had recent break up with boyfriend and increased stress at work.  Her employer has laid her off on 3 separate occasions but always re-hires her.   Trouble sleeping at night.    She is behind schedule for her mammogram.  She has not had a tetanus vaccine in several years  She is at the end of her menses today.  Started 4 days ago   No Known Allergies Past Medical History  Diagnosis Date  . Mitral valve disorders     with regurgitation  . Mitral valve insufficiency and aortic valve insufficiency   . Palpitations   . Migraine headache   . Factor V Leiden   . DVT (deep venous thrombosis)   . Von Willebrand disease   . Anxiety     Adjustment disorder with anxious/depressed mood   Past Surgical History  Procedure Date  . Mandible fracture surgery    History   Social History  . Marital Status: Legally Separated    Spouse Name: N/A    Number of Children: N/A  . Years of Education: college   Occupational History  . ACCOUNTANT    Social History Main Topics  . Smoking status: Never Smoker   . Smokeless tobacco: Never Used  . Alcohol Use: 1.0 - 1.5 oz/week    2-3 drink(s) per week  . Drug Use: No  . Sexually Active: Yes -- Female partner(s)   Other Topics Concern  . Not on file   Social History Narrative  . No narrative on file   Family History  Problem Relation Age of Onset  . Mitral valve prolapse Mother   . Heart disease Mother   . Mitral valve prolapse Maternal Grandfather   . Stroke Maternal Grandfather   . Cancer Maternal Grandmother     lung   Patient Active Problem List  Diagnosis  . MITRAL REGURGITATION, MILD  . MITRAL VALVE PROLAPSE  . PALPITATIONS, HX OF  . MIGRAINES, HX OF  . Factor V Leiden  . DVT (deep venous thrombosis)  . Migraine  headache  . Von Willebrand disease   Current Outpatient Prescriptions on File Prior to Visit  Medication Sig Dispense Refill  . buPROPion (WELLBUTRIN SR) 150 MG 12 hr tablet Take 150 mg by mouth 2 (two) times daily.        . clonazePAM (KLONOPIN) 0.5 MG tablet Take 0.25 mg by mouth daily as needed.        . Multiple Vitamin (MULTIVITAMIN) tablet Take 1 tablet by mouth daily.            Review of Systems see HPI   Objective:   Physical Exam Physical Exam  Vital signs and nursing note reviewed  Constitutional: She is oriented to person, place, and time. She appears well-developed and well-nourished. She is cooperative.  HENT:  Head: Normocephalic and atraumatic.  Right Ear: Tympanic membrane normal.  Left Ear: Tympanic membrane normal.  Nose: Nose normal.  Mouth/Throat: Oropharynx is clear and moist and mucous membranes are normal. No oropharyngeal exudate or posterior oropharyngeal erythema.  Eyes: Conjunctivae and EOM are normal. Pupils are equal, round, and reactive to light.  Neck: Neck supple. No JVD present. Carotid bruit is not present. No mass and no thyromegaly  present.  Cardiovascular: Regular rhythm, normal heart sounds, intact distal pulses and normal pulses.  Exam reveals no gallop and no friction rub.   No murmur heard. Pulses:      Dorsalis pedis pulses are 2+ on the right side, and 2+ on the left side.  Pulmonary/Chest: Breath sounds normal. She has no wheezes. She has no rhonchi. She has no rales. Right breast exhibits no mass, no nipple discharge and no skin change. Left breast exhibits no mass, no nipple discharge and no skin change.  Abdominal: Soft. Bowel sounds are normal. She exhibits no distension and no mass. There is no hepatosplenomegaly. There is no tenderness. There is no CVA tenderness.  Genitourinary: Rectum normal, vagina normal and uterus normal. No labial fusion. There is no lesion on the right labia. There is no lesion on the left labia. Cervix  exhibits no motion tenderness. Right adnexum displays no mass, no tenderness and no fullness. Left adnexum displays no mass, no tenderness and no fullness. No erythema around the vagina.  Musculoskeletal:       No active synovitis to any joint.    Lymphadenopathy:       Right cervical: No superficial cervical adenopathy present.      Left cervical: No superficial cervical adenopathy present.       Right axillary: No pectoral and no lateral adenopathy present.       Left axillary: No pectoral and no lateral adenopathy present.      Right: No inguinal adenopathy present.       Left: No inguinal adenopathy present.  Neurological: She is alert and oriented to person, place, and time. She has normal strength and normal reflexes. No cranial nerve deficit or sensory deficit. She displays a negative Romberg sign. Coordination and gait normal.  Skin: Skin is warm and dry. No abrasion, no bruising, no ecchymosis and no rash noted. No cyanosis. Nails show no clubbing.  Psychiatric: She has a normal mood and affect. Her speech is normal and behavior is normal.          Assessment & Plan:   Health Maintenance  Will give Tdap today and get mammogram today.  See scanned HM sheet.   Anxiety  Ok to refill Klonopin  0.5 mg 1/2 q12 hrs  She has number to therapist and will call if needed  MVP/Palpitations  Stable   History of DVT/Factor 5 Leiden  See me as needed         Assessment & Plan:

## 2011-09-14 ENCOUNTER — Telehealth: Payer: Self-pay | Admitting: *Deleted

## 2011-09-14 NOTE — Telephone Encounter (Signed)
Copy of labs mailed to pt's home address. 

## 2011-09-17 ENCOUNTER — Telehealth: Payer: Self-pay | Admitting: *Deleted

## 2011-09-17 NOTE — Telephone Encounter (Signed)
LM on cell phone that pap smear was normal.

## 2013-09-28 ENCOUNTER — Ambulatory Visit
Admission: RE | Admit: 2013-09-28 | Discharge: 2013-09-28 | Disposition: A | Discharge status: Home / Self Care | Payer: 59 | Source: Ambulatory Visit | Attending: Otolaryngology | Admitting: Otolaryngology

## 2013-09-28 ENCOUNTER — Other Ambulatory Visit: Payer: Self-pay | Admitting: Otolaryngology

## 2013-09-28 DIAGNOSIS — D490 Neoplasm of unspecified behavior of digestive system: Secondary | ICD-10-CM

## 2013-09-28 MED ORDER — IOHEXOL 300 MG/ML  SOLN
75.0000 mL | Freq: Once | INTRAMUSCULAR | Status: AC | PRN
  Administered 2013-09-28: 75 mL via INTRAVENOUS

## 2013-10-09 ENCOUNTER — Telehealth: Payer: Self-pay | Admitting: Hematology & Oncology

## 2013-10-09 NOTE — Telephone Encounter (Signed)
Pt aware of 7-24 appointment

## 2013-10-13 ENCOUNTER — Encounter: Payer: Self-pay | Admitting: Hematology & Oncology

## 2013-10-13 ENCOUNTER — Other Ambulatory Visit (HOSPITAL_BASED_OUTPATIENT_CLINIC_OR_DEPARTMENT_OTHER): Payer: 59 | Admitting: Lab

## 2013-10-13 ENCOUNTER — Ambulatory Visit (HOSPITAL_BASED_OUTPATIENT_CLINIC_OR_DEPARTMENT_OTHER): Payer: 59 | Admitting: Hematology & Oncology

## 2013-10-13 ENCOUNTER — Ambulatory Visit: Payer: 59

## 2013-10-13 ENCOUNTER — Ambulatory Visit: Payer: 59 | Admitting: Hematology & Oncology

## 2013-10-13 VITALS — BP 122/82 | HR 67 | Temp 98.2°F | Resp 14 | Ht 69.0 in | Wt 190.0 lb

## 2013-10-13 DIAGNOSIS — D6859 Other primary thrombophilia: Secondary | ICD-10-CM

## 2013-10-13 DIAGNOSIS — Z832 Family history of diseases of the blood and blood-forming organs and certain disorders involving the immune mechanism: Secondary | ICD-10-CM

## 2013-10-13 DIAGNOSIS — D689 Coagulation defect, unspecified: Secondary | ICD-10-CM

## 2013-10-13 DIAGNOSIS — D6851 Activated protein C resistance: Secondary | ICD-10-CM

## 2013-10-13 DIAGNOSIS — Z86718 Personal history of other venous thrombosis and embolism: Secondary | ICD-10-CM

## 2013-10-13 DIAGNOSIS — K119 Disease of salivary gland, unspecified: Secondary | ICD-10-CM

## 2013-10-13 LAB — CBC WITH DIFFERENTIAL (CANCER CENTER ONLY)
BASO#: 0 10*3/uL (ref 0.0–0.2)
BASO%: 0.4 % (ref 0.0–2.0)
EOS%: 5 % (ref 0.0–7.0)
Eosinophils Absolute: 0.2 10*3/uL (ref 0.0–0.5)
HCT: 37.4 % (ref 34.8–46.6)
HEMOGLOBIN: 12.9 g/dL (ref 11.6–15.9)
LYMPH#: 1 10*3/uL (ref 0.9–3.3)
LYMPH%: 21.6 % (ref 14.0–48.0)
MCH: 29.8 pg (ref 26.0–34.0)
MCHC: 34.5 g/dL (ref 32.0–36.0)
MCV: 86 fL (ref 81–101)
MONO#: 0.4 10*3/uL (ref 0.1–0.9)
MONO%: 9 % (ref 0.0–13.0)
NEUT%: 64 % (ref 39.6–80.0)
NEUTROS ABS: 3 10*3/uL (ref 1.5–6.5)
Platelets: 159 10*3/uL (ref 145–400)
RBC: 4.33 10*6/uL (ref 3.70–5.32)
RDW: 12.3 % (ref 11.1–15.7)
WBC: 4.8 10*3/uL (ref 3.9–10.0)

## 2013-10-13 LAB — CHCC SATELLITE - SMEAR

## 2013-10-13 NOTE — Progress Notes (Signed)
Referral MD  Reason for Referral: Von Willebrand's disease                                   Factor V Leiden mutation-heterozygous                                   Right parotid gland mass   Chief Complaint  Patient presents with  . NEW PATIENT  : I have a tumor my parotid and need surgery. I have von Willebrand's and we don't want any bleeding.  HPI: Pamela [name] is a very nice 43 year old white female. I have not seen her for 8 years. She has a history of thromboembolic disease. She has a factor V Leiden mutation. She is heterozygous. She was on oral contraceptives when she had the thromboembolic event. She was on Coumadin for 6 months.  She also has von Willebrand's disease. She's had bleeding. We have given her DDAVP in the past which helps with any surgery.  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 She has had a CT scan done. She has a 1.5 x 1.4 x 1.6 cm lesion in the superior aspect of the right parotid gland. There is is a large right jugulodigastric node measuring 2.5x0.9 cm. A left sided k digastric node measures 1.9 x 1.0 cm.  She sees a Dr. Constance Holster of ENT. He will take out this mass. However, given her history of von Willebrand's, he wants her to be seen so we can help manage any bleeding.  She is doing well. She's had no problems with bleeding. Her mother cycles are a little but heavy. Patient had no epistaxis. She's had no bruises. she is working full-time. She's having no problems with fatigue or weakness.  Again, we were asked to see her again help with preoperative management.   Past Medical History  Diagnosis Date  . Mitral valve disorders     with regurgitation  . Mitral valve insufficiency and aortic valve insufficiency   . Palpitations   . Migraine headache   . Factor V Leiden   . DVT (deep venous thrombosis)   . Von Willebrand disease   . Anxiety     Adjustment disorder with anxious/depressed mood  :  Past Surgical History  Procedure Laterality Date   . Mandible fracture surgery    :  Current outpatient prescriptions:acetaminophen (TYLENOL) 325 MG tablet, Take 650 mg by mouth every 6 (six) hours as needed for headache., Disp: , Rfl:  Current facility-administered medications:TDaP (BOOSTRIX) injection 0.5 mL, 0.5 mL, Intramuscular, Once, Lanice Shirts, MD:  . Tdap  0.5 mL Intramuscular Once  :  No Known Allergies:  Family History  Problem Relation Age of Onset  . Mitral valve prolapse Mother   . Heart disease Mother   . Mitral valve prolapse Maternal Grandfather   . Stroke Maternal Grandfather   . Cancer Maternal Grandmother     lung  :  History   Social History  . Marital Status: Legally Separated    Spouse Name: N/A    Number of Children: N/A  . Years of Education: college   Occupational History  . ACCOUNTANT    Social History Main Topics  . Smoking status: Never Smoker   . Smokeless tobacco: Never Used     Comment: never used tobacco  . Alcohol  Use: 1.0 - 1.5 oz/week    2-3 drink(s) per week  . Drug Use: No  . Sexual Activity: Yes    Partners: Male   Other Topics Concern  . Not on file   Social History Narrative  . No narrative on file  :  Pertinent items are noted in HPI.  Exam: @IPVITALS @  well-developed and well-nourished white female in no obvious distress. Her vital signs show a temperature of 98.2. Pulse 67. Blood pressure 120/82. Weight is 190 pounds. Head and neck exam is no ocular or oral lesions. She has no palpable cervical or supraclavicular lymph nodes. Lungs are clear the palliative. Cardiac exam regular rate and rhythm with no murmurs rubs or bruits. Abdomen soft. Has good bowel sounds. There is no fluid wave. There is no palpable liver or spleen tip. Back exam shows no tenderness over the spine ribs or hips. Extremities shows no clubbing cyanosis or edema. Neurological exam shows no focal neurological deficits. Skin exam no rashes ecchymosis or petechia.    Recent Labs   10/13/13 1021  WBC 4.8  HGB 12.9  HCT 37.4  PLT 159   No results found for this basename: NA, K, CL, CO2, GLUCOSE, BUN, CREATININE, CALCIUM,  in the last 72 hours  Blood smear review: No data  Pathology: No data     Assessment and Plan: Pamela [name] is a very nice 43 year old white female. She has a history of DVT. This is not to be a problem with her upcoming surgery. In her case, the DVT was provoked by estrogen therapy. She does have a heterozygous factor V Leiden mutation but this should not play a role at all because she could not go to be in bed or immobile for any particular amount time.  As far as her von Willebrand's disease, I clearly would give her in preop DDAVP. She has had this before. This works for her.  I would give her a dose of 20 mcg. This is usually given about an hour before surgery. I will we will have her to write the orders for this.  We will see where her surgery will be. We will coordinate with Dr. Constance Holster.  Again, I really don't think that we will have much issues with her bleeding if we give her the DDAVP prior to surgery.  It was nice to see Pamela [name] again. I don't think have to get her back to the office unless she plans on having more surgery or if she has a blood clot.  I spent about 45 minutes with her. It will be interesting to see what they find with this parotid lesion.

## 2013-10-16 ENCOUNTER — Telehealth: Payer: Self-pay

## 2013-10-16 NOTE — Telephone Encounter (Signed)
Received return call from [facility] at Dr Janeice Robinson office. Per Dr Antonieta Pert office note, pt to receive DDAVP 1 hour before surgery. Ivin Booty verbalizes understanding and will contact our office when pt is scheduled. dph

## 2013-10-17 ENCOUNTER — Encounter: Payer: Self-pay | Admitting: Nurse Practitioner

## 2013-10-17 ENCOUNTER — Encounter (HOSPITAL_COMMUNITY): Payer: Self-pay | Admitting: Vascular Surgery

## 2013-10-17 NOTE — Progress Notes (Addendum)
Anesthesia Chart Review:  Patient is a 43 year old female scheduled for right superficial parotidectomy on 10/27/13 by Dr. Constance Holster.  She has not yet been scheduled for a PAT visit, but Dr. Janeice Robinson office asked if I'd review available records.  Her history includes non-smoker, MVP, palpitations, Von Willebrand's disease, Factor V Leiden mutation-heterozygous, RLE DVT while on oral contraceptives, mandibular fracture surgery, migraines, adjustment disorder with anxiety and depressed mood. BMI is currently listed as 28. PCP is Dr. Emi Belfast. She was seeing cardiologist Dr. 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 Dr. Constance Holster did refer patient to hematologist Dr. Marin Olp for preoperative recommendations. He recommended DDAVP 20 mcg one hour before surgery.  He recommended PRN follow-up if she is having more surgery or if she develops a blood clot. It looks like Dr. Marin Olp was going to enter the DDAVP order once he knew the specific date of her surgery.  Echo on 10/11/07 showed: - Overall left ventricular systolic function was normal. There were no left ventricular regional wall motion abnormalities. - There was borderline dilatation of the ascending aorta and arch. Aortic root 37 mm, ascending aorta estimated 33 mm. - Borderline mitral valve prolapse. Trivial mitral regurgitation. - Left atrial size was at the upper limits of normal. - Trivial tricuspid regurgitation.  Her EKG in 05/2010 showed NSR.  She would be getting EKG and labs once her PAT appointment is scheduled. I reviewed available records with anesthesiologist Dr. Tamala Julian.  It patient has not had any significant symptoms from her MVP and her PAT results are stable then it is not anticipated that she will need to see cardiology preoperatively.  There is no documented CAD.  Ivin Booty at Dr. Janeice Robinson office reported that patient denied any acute cardiac symptoms.  Ivin Booty also faxed  Dr. Marin Olp the information regarding the timing of her surgery so he can enter the order for DDAVP.  Our schedulers will contact patient regarding scheduling a PAT visit.  George Hugh [facility] Stay [facility]/Anesthesiology Phone [phone] 10/17/2013 2:08 PM  Addendum: 10/20/2013 4:41 PM I saw patient today during her PAT visit.  She denies any chest pain, SOB, significant palpitations.  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  EKG today shows NSR.  Preoperative labs WNL.  Exam show a pleasant, red headed female in NAD.  Heart RRR, no significant murmur noted.  Lungs clear.  No significant LE edema.   Nursing staff to follow-up with Dr. Janeice Robinson office regarding need for DDAVP order.

## 2013-10-17 NOTE — Progress Notes (Signed)
Received notification from [facility] that pt will be having a R superficial parotidectomy for a parotid mass by Dr. Constance Holster on 10/27/13 @ 1100. They confirmed that the patient would need DDAVP 1hr prior to surgery. Dr. Marin Olp is aware.

## 2013-10-18 ENCOUNTER — Encounter (HOSPITAL_COMMUNITY): Payer: Self-pay | Admitting: Pharmacy Technician

## 2013-10-18 LAB — APTT: aPTT: 31 seconds (ref 24–37)

## 2013-10-18 LAB — VON WILLEBRAND PANEL
COAGULATION FACTOR VIII: 132 % (ref 73–140)
Ristocetin Co-factor, Plasma: 80 % (ref 42–200)
Von Willebrand Antigen, Plasma: 94 % (ref 50–217)

## 2013-10-19 ENCOUNTER — Telehealth: Payer: Self-pay | Admitting: Nurse Practitioner

## 2013-10-19 NOTE — Pre-Procedure Instructions (Signed)
Pamela [name]  10/19/2013   Your procedure is scheduled on:  Friday, August 7th  Report to [facility] Admitting at 0800 AM.  Call this number if you have problems the morning of surgery: [phone]   Remember:   Do not eat food or drink liquids after midnight.   Take these medicines the morning of surgery with A SIP OF WATER: tylenol if needed   Do not wear jewelry, make-up or nail polish.  Do not wear lotions, powders, or perfumes. You may wear deodorant.  Do not shave 48 hours prior to surgery. Men may shave face and neck.  Do not bring valuables to the hospital.  [facility] is not responsible for any belongings or valuables.               Contacts, dentures or bridgework may not be worn into surgery.  Leave suitcase in the car. After surgery it may be brought to your room.  For patients admitted to the hospital, discharge time is determined by your treatment team.               Patients discharged the day of surgery will not be allowed to drive home.  Please read over the following fact sheets that you were given: Pain Booklet, Coughing and Deep Breathing and Surgical Site Infection Prevention [place] - Preparing for Surgery  Before surgery, you can play an important role.  Because skin is not sterile, your skin needs to be as free of germs as possible.  You can reduce the number of germs on you skin by washing with CHG (chlorahexidine gluconate) soap before surgery.  CHG is an antiseptic cleaner which kills germs and bonds with the skin to continue killing germs even after washing.  Please DO NOT use if you have an allergy to CHG or antibacterial soaps.  If your skin becomes reddened/irritated stop using the CHG and inform your nurse when you arrive at Short Stay.  Do not shave (including legs and underarms) for at least 48 hours prior to the first CHG shower.  You may shave your face.  Please follow these instructions carefully:   1.  Shower with CHG Soap  the night before surgery and the morning of Surgery.  2.  If you choose to wash your hair, wash your hair first as usual with your normal shampoo.  3.  After you shampoo, rinse your hair and body thoroughly to remove the shampoo.  4.  Use CHG as you would any other liquid soap.  You can apply CHG directly to the skin and wash gently with scrungie or a clean washcloth.  5.  Apply the CHG Soap to your body ONLY FROM THE NECK DOWN.  Do not use on open wounds or open sores.  Avoid contact with your eyes, ears, mouth and genitals (private parts).  Wash genitals (private parts) with your normal soap.  6.  Wash thoroughly, paying special attention to the area where your surgery will be performed.  7.  Thoroughly rinse your body with warm water from the neck down.  8.  DO NOT shower/wash with your normal soap after using and rinsing off the CHG Soap.  9.  Pat yourself dry with a clean towel.            10.  Wear clean pajamas.            11.  Place clean sheets on your bed the night of your first shower  and do not sleep with pets.  Day of Surgery  Do not apply any lotions/deoderants the morning of surgery.  Please wear clean clothes to the hospital/surgery center.

## 2013-10-19 NOTE — H&P (Signed)
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 Orders  CT Neck with contrast; Requested for: 27 Sep 2013. Discussed  Right parotid mass, possibly cystic. I'm not sure why it has been intermittent but it does not seem to be an obstructed gland based on the symptoms. Recommend CT imaging. We will discuss further workup after that. Reason For Visit  Pamela [name] is here today at the kind request of Cari Caraway for consultation and opinion for infection in front of  right ear. HPI  3-4 week history of right preauricular swelling and pain. It's getting better now on her second round of antibiotics. She has something similar about 5 years ago which got better very quickly with antibiotics. In between she had no symptoms. She has a history of TMJ reconstructive surgery years ago. Otherwise healthy. No fluctuations with meals. Allergies  No Known Drug Allergies. Current Meds  Amoxicillin CAPS;; RPT. Active Problems  Migraine headache   (346.90) (G43.909). PMH  History of blood clots (V12.51) (Z86.718) History of mitral valve disorder (V12.59) (Z86.79). [facility]  Oral Surgery; TMJ 2001. Family Hx  Mitral valve problem: Mother,Brother (I05.9). Personal Hx  Never smoker No alcohol use No caffeine use Non-smoker (V49.89) (Z78.9). ROS  Systemic: Not feeling tired (fatigue).  No fever, no night sweats, and no recent weight loss. Head: Headache. Eyes: No eye symptoms. Otolaryngeal: No hearing loss  and no earache.  Tinnitus.  No purulent nasal discharge.  No nasal passage blockage (stuffiness), no snoring, no sneezing, no hoarseness, and no sore throat. Cardiovascular: No chest pain or discomfort  and no palpitations. Pulmonary: No dyspnea, no cough, and no wheezing. Gastrointestinal: No dysphagia  and no heartburn.  No nausea, no abdominal pain, and no melena.  No diarrhea. Genitourinary: No dysuria. Endocrine: No muscle weakness. Musculoskeletal: No calf muscle cramps, no arthralgias, and  no soft tissue swelling. Neurological: No dizziness, no fainting, no tingling, and no numbness. Psychological: Anxiety.  No depression. Skin: No rash. 12 system ROS was obtained and reviewed on the Health Maintenance form dated today.  Positive responses are shown above.  If the symptom is not checked, the patient has denied it. Vital Signs   Recorded by Skolimowski,Sharon on 27 Sep 2013 09:09 AM BP:106/64,  Height: 5 ft 10 in, Weight: 188 lb , BMI: 27 kg/m2,  BMI Calculated: 26.98 ,  BSA Calculated: 2.03. Physical Exam  APPEARANCE: Well developed, well nourished, in no acute distress.  Normal affect, in a pleasant mood.  Oriented to time, place and person. COMMUNICATION: Normal voice   HEAD & FACE:  No scars, lesions or masses of head and face.  Sinuses nontender to palpation.   Facial strength symmetric.  No facial lesion, scars, or mass. EYES: EOMI with normal primary gaze alignment. Visual acuity grossly intact.  PERRLA EXTERNAL EAR & NOSE: No scars, lesions or masses  EAC & TYMPANIC MEMBRANE:  EAC shows no obstructing lesions or debris and tympanic membranes are normal bilaterally with good movement to insufflation. GROSS HEARING: Normal   TMJ:  Nontender  INTRANASAL EXAM: No polyps or purulence.  NASOPHARYNX: Normal, without lesions. LIPS, TEETH & GUMS: No lip lesions, normal dentition and normal gums. ORAL CAVITY/OROPHARYNX:  Oral mucosa moist without lesion or asymmetry of the palate, tongue, tonsil or posterior pharynx. NECK:  Supple without adenopathy or mass, except for a 3 cm soft/rubbery, non-tender right parotid mass. THYROID:  Normal with no masses palpable.  NEUROLOGIC:  No gross CN deficits. No nystagmus noted.   LYMPHATIC:  No  enlarged nodes palpable. Signature  Electronically signed by : Izora Gala  M.D.; 09/27/2013 9:22 AM EST.

## 2013-10-19 NOTE — Telephone Encounter (Addendum)
Message copied by Jimmy Footman on Thu Oct 19, 2013  8:47 AM ------      Message from: Burney Gauze R      Created: Wed Oct 18, 2013  6:29 PM       Call - von willlebrand levels are ok, but I will still give the DDAVP prior to surgery!!  Laurey Arrow ------Pt verbalized understanding and her surgery is scheduled for October 27, 2013 with Dr. Constance Holster.

## 2013-10-20 ENCOUNTER — Encounter (HOSPITAL_COMMUNITY)
Admission: RE | Admit: 2013-10-20 | Discharge: 2013-10-20 | Disposition: A | Discharge status: Home / Self Care | Payer: 59 | Source: Ambulatory Visit | Attending: Otolaryngology | Admitting: Otolaryngology

## 2013-10-20 ENCOUNTER — Encounter (HOSPITAL_COMMUNITY): Payer: Self-pay

## 2013-10-20 DIAGNOSIS — Z01810 Encounter for preprocedural cardiovascular examination: Secondary | ICD-10-CM | POA: Insufficient documentation

## 2013-10-20 DIAGNOSIS — Z01812 Encounter for preprocedural laboratory examination: Secondary | ICD-10-CM | POA: Diagnosis not present

## 2013-10-20 HISTORY — DX: Other specified postprocedural states: Z98.890

## 2013-10-20 HISTORY — DX: Personal history of other venous thrombosis and embolism: Z86.718

## 2013-10-20 HISTORY — DX: Other specified postprocedural states: R11.2

## 2013-10-20 LAB — CBC
HCT: 39.7 % (ref 36.0–46.0)
Hemoglobin: 13.6 g/dL (ref 12.0–15.0)
MCH: 29.4 pg (ref 26.0–34.0)
MCHC: 34.3 g/dL (ref 30.0–36.0)
MCV: 85.9 fL (ref 78.0–100.0)
PLATELETS: 160 10*3/uL (ref 150–400)
RBC: 4.62 MIL/uL (ref 3.87–5.11)
RDW: 12.4 % (ref 11.5–15.5)
WBC: 5.1 10*3/uL (ref 4.0–10.5)

## 2013-10-20 LAB — BASIC METABOLIC PANEL
ANION GAP: 12 (ref 5–15)
BUN: 14 mg/dL (ref 6–23)
CO2: 25 meq/L (ref 19–32)
Calcium: 9.3 mg/dL (ref 8.4–10.5)
Chloride: 103 mEq/L (ref 96–112)
Creatinine, Ser: 0.65 mg/dL (ref 0.50–1.10)
GFR calc Af Amer: >90 mL/min (ref >90)
GFR calc non Af Amer: >90 mL/min (ref >90)
Glucose, Bld: 85 mg/dL (ref 70–99)
POTASSIUM: 4.5 meq/L (ref 3.7–5.3)
SODIUM: 140 meq/L (ref 137–147)

## 2013-10-21 LAB — HCG, SERUM, QUALITATIVE: PREG SERUM: NEGATIVE

## 2013-10-24 NOTE — Progress Notes (Signed)
Message left on Dr Antonieta Pert office voice mail about pt needing DDAVP before surgery on 10-27-13, and no orders noted in epic.

## 2013-10-25 ENCOUNTER — Other Ambulatory Visit: Payer: Self-pay | Admitting: Hematology & Oncology

## 2013-10-25 DIAGNOSIS — D6800 Von Willebrand disease, unspecified: Secondary | ICD-10-CM

## 2013-10-25 MED ORDER — SODIUM CHLORIDE 0.9 % IV SOLN: 20.0000 ug | INTRAVENOUS | Status: DC

## 2013-10-26 ENCOUNTER — Encounter (HOSPITAL_BASED_OUTPATIENT_CLINIC_OR_DEPARTMENT_OTHER): Admission: RE | Payer: Self-pay | Source: Ambulatory Visit

## 2013-10-26 ENCOUNTER — Ambulatory Visit (HOSPITAL_BASED_OUTPATIENT_CLINIC_OR_DEPARTMENT_OTHER): Admission: RE | Admit: 2013-10-26 | Payer: 59 | Source: Ambulatory Visit | Admitting: Otolaryngology

## 2013-10-26 SURGERY — EXCISION, PAROTID GLAND
Anesthesia: General | Laterality: Right

## 2013-10-26 MED ORDER — CEFAZOLIN SODIUM-DEXTROSE 2-3 GM-% IV SOLR
2.0000 g | INTRAVENOUS | Status: AC
  Administered 2013-10-27: 2 g via INTRAVENOUS
  Filled 2013-10-26: qty 50

## 2013-10-27 ENCOUNTER — Encounter (HOSPITAL_COMMUNITY): Payer: Self-pay | Admitting: *Deleted

## 2013-10-27 ENCOUNTER — Ambulatory Visit (HOSPITAL_COMMUNITY): Payer: 59 | Admitting: Vascular Surgery

## 2013-10-27 ENCOUNTER — Encounter (HOSPITAL_COMMUNITY): Payer: 59 | Admitting: Vascular Surgery

## 2013-10-27 ENCOUNTER — Encounter (HOSPITAL_COMMUNITY)
Admission: RE | Disposition: A | Discharge status: Home / Self Care | Payer: Self-pay | Source: Ambulatory Visit | Attending: Otolaryngology

## 2013-10-27 ENCOUNTER — Observation Stay (HOSPITAL_COMMUNITY)
Admission: RE | Admit: 2013-10-27 | Discharge: 2013-10-28 | Disposition: A | Discharge status: Home / Self Care | Payer: 59 | Source: Ambulatory Visit | Attending: Otolaryngology | Admitting: Otolaryngology

## 2013-10-27 DIAGNOSIS — G43909 Migraine, unspecified, not intractable, without status migrainosus: Secondary | ICD-10-CM | POA: Insufficient documentation

## 2013-10-27 DIAGNOSIS — Z8249 Family history of ischemic heart disease and other diseases of the circulatory system: Secondary | ICD-10-CM | POA: Insufficient documentation

## 2013-10-27 DIAGNOSIS — D6800 Von Willebrand disease, unspecified: Secondary | ICD-10-CM

## 2013-10-27 DIAGNOSIS — Z86718 Personal history of other venous thrombosis and embolism: Secondary | ICD-10-CM | POA: Diagnosis not present

## 2013-10-27 DIAGNOSIS — D119 Benign neoplasm of major salivary gland, unspecified: Principal | ICD-10-CM | POA: Insufficient documentation

## 2013-10-27 DIAGNOSIS — K116 Mucocele of salivary gland: Secondary | ICD-10-CM | POA: Diagnosis present

## 2013-10-27 DIAGNOSIS — D490 Neoplasm of unspecified behavior of digestive system: Secondary | ICD-10-CM | POA: Diagnosis present

## 2013-10-27 DIAGNOSIS — I059 Rheumatic mitral valve disease, unspecified: Secondary | ICD-10-CM | POA: Diagnosis not present

## 2013-10-27 DIAGNOSIS — Z9889 Other specified postprocedural states: Secondary | ICD-10-CM | POA: Insufficient documentation

## 2013-10-27 HISTORY — PX: PAROTIDECTOMY: SHX2163

## 2013-10-27 HISTORY — PX: PAROTIDECTOMY: SUR1003

## 2013-10-27 HISTORY — DX: Family history of other specified conditions: Z84.89

## 2013-10-27 SURGERY — EXCISION, PAROTID GLAND
Anesthesia: General | Site: Face | Laterality: Right

## 2013-10-27 MED ORDER — METOCLOPRAMIDE HCL 5 MG/ML IJ SOLN
INTRAMUSCULAR | Status: AC
  Filled 2013-10-27: qty 2

## 2013-10-27 MED ORDER — MIDAZOLAM HCL 5 MG/5ML IJ SOLN
INTRAMUSCULAR | Status: DC | PRN
  Administered 2013-10-27: 2 mg via INTRAVENOUS

## 2013-10-27 MED ORDER — ONDANSETRON HCL 4 MG/2ML IJ SOLN
INTRAMUSCULAR | Status: DC | PRN
  Administered 2013-10-27: 4 mg via INTRAVENOUS

## 2013-10-27 MED ORDER — SODIUM CHLORIDE 0.9 % IV SOLN
20.0000 ug | INTRAVENOUS | Status: AC
  Administered 2013-10-27: 20 ug via INTRAVENOUS
  Filled 2013-10-27: qty 5

## 2013-10-27 MED ORDER — LACTATED RINGERS IV SOLN
INTRAVENOUS | Status: DC
  Administered 2013-10-27: 50 mL/h via INTRAVENOUS

## 2013-10-27 MED ORDER — FENTANYL CITRATE 0.05 MG/ML IJ SOLN
INTRAMUSCULAR | Status: DC | PRN
  Administered 2013-10-27: 50 ug via INTRAVENOUS
  Administered 2013-10-27: 100 ug via INTRAVENOUS
  Administered 2013-10-27: 50 ug via INTRAVENOUS
  Administered 2013-10-27: 100 ug via INTRAVENOUS
  Administered 2013-10-27: 50 ug via INTRAVENOUS

## 2013-10-27 MED ORDER — PROPOFOL 10 MG/ML IV BOLUS
INTRAVENOUS | Status: DC | PRN
  Administered 2013-10-27: 160 mg via INTRAVENOUS

## 2013-10-27 MED ORDER — DEXAMETHASONE SODIUM PHOSPHATE 4 MG/ML IJ SOLN
INTRAMUSCULAR | Status: DC | PRN
  Administered 2013-10-27: 8 mg via INTRAVENOUS

## 2013-10-27 MED ORDER — LIDOCAINE-EPINEPHRINE 1 %-1:100000 IJ SOLN
INTRAMUSCULAR | Status: DC | PRN
  Administered 2013-10-27: 2 mL

## 2013-10-27 MED ORDER — LIDOCAINE-EPINEPHRINE 1 %-1:100000 IJ SOLN
INTRAMUSCULAR | Status: AC
  Filled 2013-10-27: qty 1

## 2013-10-27 MED ORDER — SUCCINYLCHOLINE CHLORIDE 20 MG/ML IJ SOLN
INTRAMUSCULAR | Status: AC
  Filled 2013-10-27: qty 1

## 2013-10-27 MED ORDER — ARTIFICIAL TEARS OP OINT
TOPICAL_OINTMENT | OPHTHALMIC | Status: AC
  Filled 2013-10-27: qty 3.5

## 2013-10-27 MED ORDER — EPHEDRINE SULFATE 50 MG/ML IJ SOLN
INTRAMUSCULAR | Status: AC
  Filled 2013-10-27: qty 1

## 2013-10-27 MED ORDER — PROMETHAZINE HCL 25 MG PO TABS
25.0000 mg | ORAL_TABLET | Freq: Four times a day (QID) | ORAL | Status: DC | PRN
  Administered 2013-10-27: 25 mg via ORAL
  Filled 2013-10-27: qty 1

## 2013-10-27 MED ORDER — DEXTROSE-NACL 5-0.9 % IV SOLN
INTRAVENOUS | Status: DC
  Administered 2013-10-27 – 2013-10-28 (×2): via INTRAVENOUS

## 2013-10-27 MED ORDER — LACTATED RINGERS IV SOLN
INTRAVENOUS | Status: DC | PRN
  Administered 2013-10-27 (×2): via INTRAVENOUS

## 2013-10-27 MED ORDER — ROCURONIUM BROMIDE 50 MG/5ML IV SOLN
INTRAVENOUS | Status: AC
  Filled 2013-10-27: qty 1

## 2013-10-27 MED ORDER — PROPOFOL 10 MG/ML IV BOLUS
INTRAVENOUS | Status: AC
  Filled 2013-10-27: qty 20

## 2013-10-27 MED ORDER — HYDROMORPHONE HCL PF 1 MG/ML IJ SOLN
INTRAMUSCULAR | Status: AC
  Filled 2013-10-27: qty 1

## 2013-10-27 MED ORDER — BACITRACIN ZINC 500 UNIT/GM EX OINT
TOPICAL_OINTMENT | CUTANEOUS | Status: AC
  Filled 2013-10-27: qty 15

## 2013-10-27 MED ORDER — METOCLOPRAMIDE HCL 5 MG/ML IJ SOLN
10.0000 mg | Freq: Once | INTRAMUSCULAR | Status: AC
  Administered 2013-10-27: 10 mg via INTRAVENOUS

## 2013-10-27 MED ORDER — PROMETHAZINE HCL 25 MG RE SUPP: 25.0000 mg | Freq: Four times a day (QID) | RECTAL | Status: DC | PRN

## 2013-10-27 MED ORDER — MIDAZOLAM HCL 2 MG/2ML IJ SOLN
INTRAMUSCULAR | Status: AC
  Filled 2013-10-27: qty 2

## 2013-10-27 MED ORDER — LIDOCAINE HCL (CARDIAC) 20 MG/ML IV SOLN
INTRAVENOUS | Status: AC
  Filled 2013-10-27: qty 5

## 2013-10-27 MED ORDER — SODIUM CHLORIDE 0.9 % IJ SOLN
INTRAMUSCULAR | Status: AC
  Filled 2013-10-27: qty 10

## 2013-10-27 MED ORDER — ONDANSETRON HCL 4 MG/2ML IJ SOLN
INTRAMUSCULAR | Status: AC
  Filled 2013-10-27: qty 2

## 2013-10-27 MED ORDER — HYDROCODONE-ACETAMINOPHEN 7.5-325 MG PO TABS: 1.0000 | ORAL_TABLET | Freq: Four times a day (QID) | ORAL | Status: DC | PRN

## 2013-10-27 MED ORDER — SUCCINYLCHOLINE CHLORIDE 20 MG/ML IJ SOLN
INTRAMUSCULAR | Status: DC | PRN
  Administered 2013-10-27: 100 mg via INTRAVENOUS

## 2013-10-27 MED ORDER — FENTANYL CITRATE 0.05 MG/ML IJ SOLN
INTRAMUSCULAR | Status: AC
  Filled 2013-10-27: qty 5

## 2013-10-27 MED ORDER — 0.9 % SODIUM CHLORIDE (POUR BTL) OPTIME
TOPICAL | Status: DC | PRN
  Administered 2013-10-27: 1000 mL

## 2013-10-27 MED ORDER — PHENYLEPHRINE HCL 10 MG/ML IJ SOLN
INTRAMUSCULAR | Status: AC
  Filled 2013-10-27: qty 1

## 2013-10-27 MED ORDER — HYDROCODONE-ACETAMINOPHEN 5-325 MG PO TABS
1.0000 | ORAL_TABLET | ORAL | Status: DC | PRN
  Administered 2013-10-27 – 2013-10-28 (×4): 2 via ORAL
  Filled 2013-10-27 (×4): qty 2

## 2013-10-27 MED ORDER — LIDOCAINE HCL (CARDIAC) 20 MG/ML IV SOLN
INTRAVENOUS | Status: DC | PRN
  Administered 2013-10-27: 50 mg via INTRAVENOUS

## 2013-10-27 MED ORDER — HYDROMORPHONE HCL PF 1 MG/ML IJ SOLN
0.2500 mg | INTRAMUSCULAR | Status: DC | PRN
  Administered 2013-10-27 (×4): 0.5 mg via INTRAVENOUS

## 2013-10-27 MED ORDER — NEOSTIGMINE METHYLSULFATE 10 MG/10ML IV SOLN
INTRAVENOUS | Status: AC
  Filled 2013-10-27: qty 1

## 2013-10-27 SURGICAL SUPPLY — 51 items
ADH SKN CLS APL DERMABOND .7 (GAUZE/BANDAGES/DRESSINGS) ×1
ATTRACTOMAT 16X20 MAGNETIC DRP (DRAPES) IMPLANT
CANISTER SUCTION 2500CC (MISCELLANEOUS) ×3 IMPLANT
CLEANER TIP ELECTROSURG 2X2 (MISCELLANEOUS) ×3 IMPLANT
CONT SPEC 4OZ CLIKSEAL STRL BL (MISCELLANEOUS) ×2 IMPLANT
CORDS BIPOLAR (ELECTRODE) ×3 IMPLANT
COVER SURGICAL LIGHT HANDLE (MISCELLANEOUS) ×3 IMPLANT
DERMABOND ADVANCED (GAUZE/BANDAGES/DRESSINGS) ×2
DERMABOND ADVANCED .7 DNX12 (GAUZE/BANDAGES/DRESSINGS) IMPLANT
DRAIN JACKSON RD 7FR 3/32 (WOUND CARE) ×2 IMPLANT
DRAIN SNY 10 ROU (WOUND CARE) IMPLANT
DRAPE INCISE 23X17 IOBAN STRL (DRAPES) ×2
DRAPE INCISE 23X17 STRL (DRAPES) ×1 IMPLANT
DRAPE INCISE IOBAN 23X17 STRL (DRAPES) ×1 IMPLANT
ELECT COATED BLADE 2.86 ST (ELECTRODE) ×3 IMPLANT
ELECT REM PT RETURN 9FT ADLT (ELECTROSURGICAL) ×3
ELECTRODE REM PT RTRN 9FT ADLT (ELECTROSURGICAL) ×1 IMPLANT
EVACUATOR SILICONE 100CC (DRAIN) ×2 IMPLANT
FORCEPS BIPOLAR SPETZLER 8 1.0 (NEUROSURGERY SUPPLIES) ×3 IMPLANT
GAUZE SPONGE 4X4 16PLY XRAY LF (GAUZE/BANDAGES/DRESSINGS) ×5 IMPLANT
GLOVE BIO SURGEON STRL SZ 6.5 (GLOVE) ×1 IMPLANT
GLOVE BIO SURGEONS STRL SZ 6.5 (GLOVE) ×1
GLOVE BIOGEL PI IND STRL 6.5 (GLOVE) IMPLANT
GLOVE BIOGEL PI IND STRL 7.0 (GLOVE) IMPLANT
GLOVE BIOGEL PI INDICATOR 6.5 (GLOVE) ×2
GLOVE BIOGEL PI INDICATOR 7.0 (GLOVE) ×2
GLOVE ECLIPSE 7.5 STRL STRAW (GLOVE) ×3 IMPLANT
GLOVE SURG SS PI 7.0 STRL IVOR (GLOVE) ×2 IMPLANT
GOWN STRL REUS W/ TWL LRG LVL3 (GOWN DISPOSABLE) ×2 IMPLANT
GOWN STRL REUS W/TWL LRG LVL3 (GOWN DISPOSABLE) ×9
KIT BASIN OR (CUSTOM PROCEDURE TRAY) ×3 IMPLANT
KIT ROOM TURNOVER OR (KITS) ×3 IMPLANT
LOCATOR NERVE 3 VOLT (DISPOSABLE) ×3 IMPLANT
NEEDLE 27GAX1X1/2 (NEEDLE) ×2 IMPLANT
NS IRRIG 1000ML POUR BTL (IV SOLUTION) ×3 IMPLANT
PAD ARMBOARD 7.5X6 YLW CONV (MISCELLANEOUS) ×6 IMPLANT
PENCIL FOOT CONTROL (ELECTRODE) ×3 IMPLANT
SHEARS HARMONIC 9CM CVD (BLADE) ×2 IMPLANT
SOLUTION BETADINE 4OZ (MISCELLANEOUS) ×2 IMPLANT
STAPLER VISISTAT 35W (STAPLE) ×3 IMPLANT
SUT CHROMIC 4 0 PS 2 18 (SUTURE) ×3 IMPLANT
SUT ETHILON 5 0 P 3 18 (SUTURE) ×2
SUT NYLON ETHILON 5-0 P-3 1X18 (SUTURE) IMPLANT
SUT SILK 2 0 SH CR/8 (SUTURE) ×3 IMPLANT
SUT SILK 4 0 REEL (SUTURE) ×3 IMPLANT
SYR CONTROL 10ML LL (SYRINGE) IMPLANT
TAPE HY-TAPE 1X5Y PINK NS LF (GAUZE/BANDAGES/DRESSINGS) ×2 IMPLANT
TOWEL OR 17X24 6PK STRL BLUE (TOWEL DISPOSABLE) ×3 IMPLANT
TOWEL OR 17X26 10 PK STRL BLUE (TOWEL DISPOSABLE) ×3 IMPLANT
TRAY ENT MC OR (CUSTOM PROCEDURE TRAY) ×3 IMPLANT
TRAY FOLEY CATH 14FRSI W/METER (CATHETERS) IMPLANT

## 2013-10-27 NOTE — Anesthesia Preprocedure Evaluation (Addendum)
Anesthesia Evaluation  Patient identified by MRN, date of birth, ID band Patient awake    Reviewed: Allergy & Precautions, H&P , NPO status , Patient's Chart, lab work & pertinent test results  History of Anesthesia Complications (+) PONV  Airway Mallampati: II TM Distance: >3 FB Neck ROM: Full    Dental  (+) Teeth Intact, Dental Advisory Given   Pulmonary          Cardiovascular negative cardio ROS   MVP   Neuro/Psych  Headaches,    GI/Hepatic negative GI ROS, Neg liver ROS,   Endo/Other  negative endocrine ROS  Renal/GU negative Renal ROS     Musculoskeletal   Abdominal   Peds  Hematology  (+) Blood dyscrasia, , Von Willibrands   Anesthesia Other Findings   Reproductive/Obstetrics                          Anesthesia Physical Anesthesia Plan  ASA: II  Anesthesia Plan: General   Post-op Pain Management:    Induction: Intravenous  Airway Management Planned: Oral ETT  Additional Equipment:   Intra-op Plan:   Post-operative Plan: Extubation in OR  Informed Consent: I have reviewed the patients History and Physical, chart, labs and discussed the procedure including the risks, benefits and alternatives for the proposed anesthesia with the patient or authorized representative who has indicated his/her understanding and acceptance.   Dental advisory given  Plan Discussed with: CRNA and Anesthesiologist  Anesthesia Plan Comments:         Anesthesia Quick Evaluation

## 2013-10-27 NOTE — Interval H&P Note (Signed)
History and Physical Interval Note:  10/27/2013 9:27 AM  Pamela [name]  has presented today for surgery, with the diagnosis of RIGHT PAROTID MASS  The various methods of treatment have been discussed with the patient and family. After consideration of risks, benefits and other options for treatment, the patient has consented to  Procedure(s): RIGHT SUPERFICIAL PAROTIDECTOMY (Right) as a surgical intervention .  The patient's history has been reviewed, patient examined, no change in status, stable for surgery.  I have reviewed the patient's chart and labs.  Questions were answered to the patient's satisfaction.     Toma Arts

## 2013-10-27 NOTE — Anesthesia Procedure Notes (Signed)
Procedure Name: Intubation Date/Time: 10/27/2013 10:11 AM Performed by: Maude Leriche D Pre-anesthesia Checklist: Patient identified, Emergency Drugs available, Suction available, Patient being monitored and Timeout performed Patient Re-evaluated:Patient Re-evaluated prior to inductionOxygen Delivery Method: Circle system utilized Preoxygenation: Pre-oxygenation with 100% oxygen Intubation Type: IV induction Ventilation: Mask ventilation without difficulty Laryngoscope size: Elective Glidescope. Grade View: Grade I Tube type: Oral Tube size: 7.5 mm Number of attempts: 1 Airway Equipment and Method: Stylet and Video-laryngoscopy Placement Confirmation: ETT inserted through vocal cords under direct vision,  positive ETCO2 and breath sounds checked- equal and bilateral Secured at: 22 cm Tube secured with: Tape Dental Injury: Teeth and Oropharynx as per pre-operative assessment

## 2013-10-27 NOTE — Op Note (Signed)
OPERATIVE REPORT  DATE OF SURGERY: 10/27/2013  PATIENT:  Pamela [name],  43 y.o. female  PRE-OPERATIVE DIAGNOSIS:  RIGHT PAROTID MASS  POST-OPERATIVE DIAGNOSIS:  RIGHT PAROTID MASS  PROCEDURE:  Procedure(s): RIGHT SUPERFICIAL PAROTIDECTOMY  SURGEON:  Beckie Salts, MD  ASSISTANTS: Jolene Provost, PA  ANESTHESIA:   General   EBL:  30 ml  DRAINS: 7 French round J-P  LOCAL MEDICATIONS USED:  1% Xylocaine with epinephrine  SPECIMEN:  Right superficial parotid  COUNTS:  Correct  PROCEDURE DETAILS: The patient was taken to the operating room and placed on the operating table in the supine position. Following induction of general endotracheal anesthesia, the right face was prepped and draped in a standard fashion. A preauricular incision was outlined with a marking pen with extension down around the earlobe and across the mastoid process back towards the cervical skin crease. The proposed incision was infiltrated with local anesthetic solution. A 15 scalpel was used to incise the skin. Electrocautery was used to continue the dissection and a superficial skin flap was elevated anteriorly. The greater regular nerve was identified in the branch to the earlobe was identified and dissected out and preserved. The gland was dissected forward off the sternocleidomastoid muscle and off the ear canal.  The posterior belly of the digastric muscle was identified and the main trunk of the facial nerve was then identified exiting the stylomastoid foramen. A McCabe dissector was then used to dissected the main trunk towards the pes and then continuing with all the major branches starting superiorly.the harmonic dissector was used to dissect the parotid tissue. 4-0 silk ties were used as needed for larger vessels. The specimen was removed. The palpable mass was approximately 1-1/2 cm and firm. This was sent for pathologic evaluation. No other masses were identified.  The wound was irrigated and  hemostasis was completed. A battery operated nerve stimulator was used and all branches stimulated. A 7 French drain was exited through a separate stab incision and secured in place with 5-0 nylon suture. The wound was closed using a running subcuticular chromic suture and Dermabond on the skin. The patient was awakened, extubated and transferred to recovery in stable condition.    PATIENT DISPOSITION:  To PACU, stable

## 2013-10-27 NOTE — Discharge Instructions (Signed)
It is okay to shower and use soap and water but do not use any cream, oils or ointment.

## 2013-10-27 NOTE — Anesthesia Postprocedure Evaluation (Signed)
  Anesthesia Post-op Note  Patient: Pamela [name]  Procedure(s) Performed: Procedure(s): RIGHT SUPERFICIAL PAROTIDECTOMY (Right)  Patient Location: PACU  Anesthesia Type:General  Level of Consciousness: awake  Airway and Oxygen Therapy: Patient Spontanous Breathing  Post-op Pain: mild  Post-op Assessment: Post-op Vital signs reviewed  Post-op Vital Signs: Reviewed  Last Vitals:  Filed Vitals:   10/27/13 1318  BP: 117/74  Pulse: 66  Temp: 36.6 C  Resp: 16    Complications: No apparent anesthesia complications

## 2013-10-27 NOTE — Progress Notes (Signed)
Patient ID: Pamela [name], female   DOB: 21-May-1970, 43 y.o.   MRN: [id number] Doing well after surgery.  Had some nausea earlier today that has improved. AF VSS Alert NAD. Right parotid incision clean and intact, no fluid collection, drain functioning. Right facial movement normal. A/P: s/p Right parotidectomy Observe overnight with drain in place. Likely remove drain and discharge in am.

## 2013-10-27 NOTE — Transfer of Care (Signed)
Immediate Anesthesia Transfer of Care Note  Patient: Pamela [name]  Procedure(s) Performed: Procedure(s): RIGHT SUPERFICIAL PAROTIDECTOMY (Right)  Patient Location: PACU  Anesthesia Type:General  Level of Consciousness: awake  Airway & Oxygen Therapy: Patient Spontanous Breathing and Patient connected to nasal cannula oxygen  Post-op Assessment: Report given to PACU RN and Post -op Vital signs reviewed and stable  Post vital signs: Reviewed and stable  Complications: No apparent anesthesia complications

## 2013-10-28 DIAGNOSIS — D119 Benign neoplasm of major salivary gland, unspecified: Secondary | ICD-10-CM | POA: Diagnosis not present

## 2013-10-28 NOTE — Discharge Summary (Signed)
Physician Discharge Summary  Patient ID: Pamela [name] MRN: [id number] DOB/AGE: 1971/03/18 43 y.o.  Admit date: 10/27/2013 Discharge date: 10/28/2013  Admission Diagnoses: Right parotid neoplasm  Discharge Diagnoses:  Active Problems:   Parotid neoplasm   Discharged Condition: good  Hospital Course: 43 year old female underwent right parotidectomy for neoplasm.  See operative note.  She was observed overnight with drain in place and did well.  Drain output was small and drain was removed on POD 1.  Felt stable for discharge.  Consults: None  Significant Diagnostic Studies: None  Treatments: surgery: Right parotidectomy  Discharge Exam: Blood pressure 113/65, pulse 66, temperature 98.6 F (37 C), temperature source Oral, resp. rate 18, height 5' 9.5" (1.765 m), weight 82.509 kg (181 lb 14.4 oz), last menstrual period 10/26/2013, SpO2 97.00%. General appearance: alert, cooperative and no distress Neck: Right parotid incision clean and intact, no fluid collection, drain removed.  Right face moving normally.  Disposition:   Discharge Instructions   Diet - low sodium heart healthy    Complete by:  As directed      Discharge instructions    Complete by:  As directed   Do not apply any ointment to the incision.  OK to allow it to get wet, gently pat dry.  Maintain a bland diet.  Avoid strenuous activity.     Increase activity slowly    Complete by:  As directed             Medication List         acetaminophen 500 MG tablet  Commonly known as:  TYLENOL  Take 1,000 mg by mouth 3 (three) times daily as needed for mild pain or headache.     HYDROcodone-acetaminophen 7.5-325 MG per tablet  Commonly known as:  NORCO  Take 1 tablet by mouth every 6 (six) hours as needed for moderate pain.     promethazine 25 MG suppository  Commonly known as:  PHENERGAN  Place 1 suppository (25 mg total) rectally every 6 (six) hours as needed for nausea or vomiting.            Follow-up Information   Follow up with Izora Gala, MD. Schedule an appointment as soon as possible for a visit in 1 week.   Specialty:  Otolaryngology   Contact information:   [address] [place] [address] [phone]       Signed: Melida Quitter 10/28/2013, 9:41 AM

## 2013-10-30 ENCOUNTER — Encounter (HOSPITAL_COMMUNITY): Payer: Self-pay | Admitting: Otolaryngology

## 2013-10-30 ENCOUNTER — Telehealth: Payer: Self-pay | Admitting: Internal Medicine

## 2013-10-30 NOTE — Telephone Encounter (Signed)
Spoke with pt.  She is doing well from superficial parotidectomy.    She had "hard knot " in her cheek 5 years ago and had seen Dr. Addison Lank at that time - was given an antibiotic and mass resolved  Lump returned and she was seen at after hours clinic on [address] - given antibiotics but mass did not completely resolve - pt was referred to ENT with subsequent surgery

## 2014-01-22 ENCOUNTER — Encounter (HOSPITAL_COMMUNITY): Payer: Self-pay | Admitting: Otolaryngology

## 2014-04-30 ENCOUNTER — Other Ambulatory Visit: Payer: Self-pay | Admitting: *Deleted

## 2014-04-30 DIAGNOSIS — Z0000 Encounter for general adult medical examination without abnormal findings: Secondary | ICD-10-CM

## 2014-05-01 ENCOUNTER — Encounter: Payer: Self-pay | Admitting: Internal Medicine

## 2014-05-01 ENCOUNTER — Other Ambulatory Visit: Payer: Self-pay | Admitting: Internal Medicine

## 2014-05-01 ENCOUNTER — Ambulatory Visit (INDEPENDENT_AMBULATORY_CARE_PROVIDER_SITE_OTHER): Payer: Managed Care, Other (non HMO) | Admitting: Internal Medicine

## 2014-05-01 VITALS — BP 114/81 | HR 72 | Resp 16 | Wt 192.0 lb

## 2014-05-01 DIAGNOSIS — Z23 Encounter for immunization: Secondary | ICD-10-CM

## 2014-05-01 DIAGNOSIS — D6851 Activated protein C resistance: Secondary | ICD-10-CM

## 2014-05-01 DIAGNOSIS — Z124 Encounter for screening for malignant neoplasm of cervix: Secondary | ICD-10-CM

## 2014-05-01 DIAGNOSIS — R928 Other abnormal and inconclusive findings on diagnostic imaging of breast: Secondary | ICD-10-CM

## 2014-05-01 DIAGNOSIS — F419 Anxiety disorder, unspecified: Secondary | ICD-10-CM

## 2014-05-01 DIAGNOSIS — Z0000 Encounter for general adult medical examination without abnormal findings: Secondary | ICD-10-CM

## 2014-05-01 DIAGNOSIS — I080 Rheumatic disorders of both mitral and aortic valves: Secondary | ICD-10-CM

## 2014-05-01 DIAGNOSIS — Z1151 Encounter for screening for human papillomavirus (HPV): Secondary | ICD-10-CM

## 2014-05-01 DIAGNOSIS — D490 Neoplasm of unspecified behavior of digestive system: Secondary | ICD-10-CM

## 2014-05-01 LAB — POCT URINALYSIS DIPSTICK
Bilirubin, UA: NEGATIVE
GLUCOSE UA: NEGATIVE
Ketones, UA: NEGATIVE
Leukocytes, UA: NEGATIVE
NITRITE UA: NEGATIVE
PROTEIN UA: NEGATIVE
RBC UA: NEGATIVE
Spec Grav, UA: 1.015
UROBILINOGEN UA: 0.2
pH, UA: 6.5

## 2014-05-01 MED ORDER — LORAZEPAM 0.5 MG PO TABS: 0.5000 mg | ORAL_TABLET | Freq: Two times a day (BID) | ORAL | Status: DC | PRN

## 2014-05-01 NOTE — Progress Notes (Signed)
Subjective:    Patient ID: Pamela [name], female    DOB: 1970-12-29, 44 y.o.   MRN: [id number]  HPI 09/2013 hematology note Assessment and Plan: Pamela [name] is a very nice 44 year old white female. She has a history of DVT. This is not to be a problem with her upcoming surgery. In her case, the DVT was provoked by estrogen therapy. She does have a heterozygous factor V Leiden mutation but this should not play a role at all because she could not go to be in bed or immobile for any particular amount time.  As far as her von Willebrand's disease, I clearly would give her in preop DDAVP. She has had this before. This works for her.  I would give her a dose of 20 mcg. This is usually given about an hour before surgery. I will we will have her to write the orders for this.  We will see where her surgery will be. We will coordinate with Dr. Constance Holster.  Again, I really don't think that we will have much issues with her bleeding if we give her the DDAVP prior to surgery.  It was nice to see Ms. [name] again. I don't think have to get her back to the office unless she plans on having more surgery or if she has a blood clot.  I spent about 45 minutes with her. It will be interesting to see what they find with this parotid lesion.      TODAY:  Pamela [name] is here for CPE  Since last visit she has had resection of Right parotid tumor with benign pathology .  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 Mitral valve disorders     with regurgitation  . Mitral valve insufficiency and aortic valve insufficiency   . Palpitations   . Migraine headache   . Factor V Leiden   . DVT  (deep venous thrombosis)   . Von Willebrand disease   . Anxiety     Adjustment disorder with anxious/depressed mood  . PONV (postoperative nausea and vomiting)   . History of blood clots   . Family history of anesthesia complication    Past Surgical History  Procedure Laterality Date  . Mandible fracture surgery    . Parotidectomy Right 10/27/2013    superficial   on right           Dr Constance Holster  . Parotidectomy Right 10/27/2013    Procedure: RIGHT SUPERFICIAL PAROTIDECTOMY;  Surgeon: Izora Gala, MD;  Location: [facility];  Service: ENT;  Laterality: Right;   History   Social History  . Marital Status: Legally Separated    Spouse Name: N/A    Number of Children: N/A  . Years of Education: college   Occupational History  . ACCOUNTANT    Social History Main Topics  . Smoking status: Never Smoker   . Smokeless tobacco: Never Used     Comment: never used tobacco  . Alcohol Use: 1.0 - 1.5 oz/week    2-3 drink(s) per week  . Drug Use: No  . Sexual Activity:    Partners: Male  Other Topics Concern  . Not on file   Social History Narrative   Family History  Problem Relation Age of Onset  . Mitral valve prolapse Mother   . Heart disease Mother   . Mitral valve prolapse Maternal Grandfather   . Stroke Maternal Grandfather   . Cancer Maternal Grandmother     lung   Patient Active Problem List   Diagnosis Date Noted  . 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 Anxiety 09/10/2011  . Factor V Leiden   . DVT (deep venous thrombosis)   . Migraine headache   . Von Willebrand disease   . MITRAL REGURGITATION, MILD 03/06/2009  . MITRAL VALVE PROLAPSE 03/06/2009  . PALPITATIONS, HX OF 03/06/2009  . MIGRAINES, HX OF 03/06/2009   Current Outpatient Prescriptions on File Prior to Visit  Medication Sig Dispense Refill  . acetaminophen (TYLENOL) 500 MG tablet Take 1,000 mg by mouth 3 (three) times daily as needed for mild pain or headache.     Marland Kitchen HYDROcodone-acetaminophen (NORCO) 7.5-325 MG per  tablet Take 1 tablet by mouth every 6 (six) hours as needed for moderate pain. 30 tablet 0  . promethazine (PHENERGAN) 25 MG suppository Place 1 suppository (25 mg total) rectally every 6 (six) hours as needed for nausea or vomiting. 12 suppository 1   Current Facility-Administered Medications on File Prior to Visit  Medication Dose Route Frequency Provider Last Rate Last Dose  . TDaP (BOOSTRIX) injection 0.5 mL  0.5 mL Intramuscular Once Lanice Shirts, MD           Review of Systems  Respiratory: Negative for cough, chest tightness, shortness of breath and wheezing.   Cardiovascular: Negative for chest pain, palpitations and leg swelling.  All other systems reviewed and are negative.      Objective:   Physical Exam Physical Exam  Vital signs and nursing note reviewed  Constitutional: She is oriented to person, place, and time. She appears well-developed and well-nourished. She is cooperative.  HENT:  Head: Normocephalic and atraumatic.  Right Ear: Tympanic membrane normal.  Left Ear: Tympanic membrane normal.  Nose: Nose normal.  Mouth/Throat: Oropharynx is clear and moist and mucous membranes are normal. No oropharyngeal exudate or posterior oropharyngeal erythema.  Eyes: Conjunctivae and EOM are normal. Pupils are equal, round, and reactive to light.  Neck: Neck supple. No JVD present. Carotid bruit is not present. No mass and no thyromegaly present.  Cardiovascular: Regular rhythm, normal heart sounds, intact distal pulses and normal pulses.  Exam reveals no gallop and no friction rub.   No murmur heard. Pulses:      Dorsalis pedis pulses are 2+ on the right side, and 2+ on the left side.  Pulmonary/Chest: Breath sounds normal. She has no wheezes. She has no rhonchi. She has no rales. Right breast exhibits no mass, no nipple discharge and no skin change. Left breast exhibits no mass, no nipple discharge and no skin change.  Abdominal: Soft. Bowel sounds are normal. She  exhibits no distension and no mass. There is no hepatosplenomegaly. There is no tenderness. There is no CVA tenderness.  Genitourinary: Rectum normal, vagina normal and uterus normal. Rectal exam shows no mass. Guaiac negative stool. No labial fusion. There is no lesion on the right labia. There is no lesion on the left labia. Cervix exhibits no motion tenderness. Right adnexum displays no mass, no tenderness and no fullness. Left adnexum displays no mass, no tenderness and no fullness. No erythema around the vagina.  Musculoskeletal:  No active synovitis to any joint.    Lymphadenopathy:       Right cervical: No superficial cervical adenopathy present.      Left cervical: No superficial cervical adenopathy present.       Right axillary: No pectoral and no lateral adenopathy present.       Left axillary: No pectoral and no lateral adenopathy present.      Right: No inguinal adenopathy present.       Left: No inguinal adenopathy present.  Neurological: She is alert and oriented to person, place, and time. She has normal strength and normal reflexes. No cranial nerve deficit or sensory deficit. She displays a negative Romberg sign. Coordination and gait normal.  Skin: Skin is warm and dry. No abrasion, no bruising, no ecchymosis and no rash noted. No cyanosis. Nails show no clubbing.  Psychiatric: She has a normal mood and affect. Her speech is normal and behavior is normal.          Assessment & Plan:   HM  Flu vaccine today  Pap today  Will scheduel 3D mm  All labs today   She is a non-smoker   MVP/ MR  I gave pt name of 3 different cardiologists in the [place] group for her to schedule a follow up appt.  She voices understanding  Factor 5 Leiden/ h/o DVT  Managed by hematology    Situational anxiety ok for prn Ativan   Excision parotid tumor        Assessment & Plan:

## 2014-05-02 ENCOUNTER — Encounter: Payer: Self-pay | Admitting: *Deleted

## 2014-05-04 LAB — CYTOLOGY - PAP

## 2014-05-11 ENCOUNTER — Other Ambulatory Visit: Payer: Self-pay | Admitting: Internal Medicine

## 2014-05-11 ENCOUNTER — Ambulatory Visit
Admission: RE | Admit: 2014-05-11 | Discharge: 2014-05-11 | Disposition: A | Discharge status: Home / Self Care | Payer: Managed Care, Other (non HMO) | Source: Ambulatory Visit | Attending: Internal Medicine | Admitting: Internal Medicine

## 2014-05-11 DIAGNOSIS — R928 Other abnormal and inconclusive findings on diagnostic imaging of breast: Secondary | ICD-10-CM

## 2014-05-25 ENCOUNTER — Ambulatory Visit (HOSPITAL_COMMUNITY): Payer: Managed Care, Other (non HMO) | Attending: Cardiology | Admitting: Cardiology

## 2014-05-25 ENCOUNTER — Ambulatory Visit (INDEPENDENT_AMBULATORY_CARE_PROVIDER_SITE_OTHER): Payer: Managed Care, Other (non HMO) | Admitting: Cardiovascular Disease

## 2014-05-25 ENCOUNTER — Encounter: Payer: Self-pay | Admitting: Cardiovascular Disease

## 2014-05-25 VITALS — BP 110/78 | HR 65 | Ht 69.5 in | Wt 192.8 lb

## 2014-05-25 DIAGNOSIS — I080 Rheumatic disorders of both mitral and aortic valves: Secondary | ICD-10-CM

## 2014-05-25 DIAGNOSIS — D6851 Activated protein C resistance: Secondary | ICD-10-CM

## 2014-05-25 DIAGNOSIS — I059 Rheumatic mitral valve disease, unspecified: Secondary | ICD-10-CM | POA: Diagnosis not present

## 2014-05-25 DIAGNOSIS — I351 Nonrheumatic aortic (valve) insufficiency: Secondary | ICD-10-CM

## 2014-05-25 DIAGNOSIS — I82409 Acute embolism and thrombosis of unspecified deep veins of unspecified lower extremity: Secondary | ICD-10-CM

## 2014-05-25 NOTE — Progress Notes (Signed)
Cardiology Office Note   Date:  05/25/2014   ID:  Pamela [name], DOB May 15, 1970, MRN [id number]  PCP:  Pamela Pillar, MD  Cardiologist:   Pamela Headings, MD   No chief complaint on file.  Problem list: 1. Mitral regurgitation 2. Factor V Leiden deficiency 3. History of DVT 4. Acquired Von willibrands syndrome  - [place]    History of Present Illness: Pamela [name] is a 44 y.o. female who presents for check up .  She is a previous patient of Dr. Mar [name].   She has had some usual pain .  Has been under lots of stress.   Her brother and mother both have had MV repair.    In 2003, she had a DVT while on birth control pills.  Was found to have Factor V Leiden deficiency.   BC pills were stopped.  Took coumadin for 3 months.  Difficulty in getting INR regulated.    Has been working at [employer].  Is retiring today. Looking forward to getting back into an exercise program.    Past Medical History  Diagnosis Date  . Mitral valve disorders     with regurgitation  . Mitral valve insufficiency and aortic valve insufficiency   . Palpitations   . Migraine headache   . Factor V Leiden   . DVT (deep venous thrombosis)   . Von Willebrand disease   . Anxiety     Adjustment disorder with anxious/depressed mood  . PONV (postoperative nausea and vomiting)   . History of blood clots   . Family history of anesthesia complication     Past Surgical History  Procedure Laterality Date  . Mandible fracture surgery    . Parotidectomy Right 10/27/2013    superficial   on right           Dr Pamela [name]  . Parotidectomy Right 10/27/2013    Procedure: RIGHT SUPERFICIAL PAROTIDECTOMY;  Surgeon: Pamela Gala, MD;  Location: [facility];  Service: ENT;  Laterality: Right;     Current Outpatient Prescriptions  Medication Sig Dispense Refill  . LORazepam (ATIVAN) 0.5 MG tablet Take 1 tablet (0.5 mg total) by mouth 2 (two) times daily as needed for anxiety. (Patient not taking: Reported  on 05/25/2014) 30 tablet 1   Current Facility-Administered Medications  Medication Dose Route Frequency Provider Last Rate Last Dose  . TDaP (BOOSTRIX) injection 0.5 mL  0.5 mL Intramuscular Once Pamela Shirts, MD        Allergies:   Review of patient's allergies indicates no known allergies.    Social History:  The patient  reports that she has never smoked. She has never used smokeless tobacco. She reports that she drinks about 1.0 - 1.5 oz of alcohol per week. She reports that she does not use illicit drugs.   Family History:  The patient's family history includes Cancer in her maternal grandmother; Heart disease in her mother; Mitral valve prolapse in her maternal grandfather and mother; Stroke in her maternal grandfather.    ROS:  Please see the history of present illness.    Review of Systems: Constitutional:  denies fever, chills, diaphoresis, appetite change and fatigue.  HEENT: denies photophobia, eye pain, redness, hearing loss, ear pain, congestion, sore throat, rhinorrhea, sneezing, neck pain, neck stiffness and tinnitus.  Respiratory: denies SOB, DOE, cough, chest tightness, and wheezing.  Cardiovascular: admits to  Atypical chest pain,   Gastrointestinal: denies nausea, vomiting, abdominal pain, diarrhea, constipation, blood in stool.  Genitourinary: denies dysuria, urgency, frequency, hematuria, flank pain and difficulty urinating.  Musculoskeletal: denies  myalgias, back pain, joint swelling, arthralgias and gait problem.   Skin: denies pallor, rash and wound.  Neurological: denies dizziness, seizures, syncope, weakness, light-headedness, numbness and headaches.   Hematological: denies adenopathy, easy bruising, personal or family bleeding history.  Psychiatric/ Behavioral: denies suicidal ideation, mood changes, confusion, nervousness, sleep disturbance and agitation.       All other systems are reviewed and negative.    PHYSICAL EXAM: VS:  BP 110/78 mmHg   Pulse 65  Ht 5' 9.5" (1.765 m)  Wt 192 lb 12.8 oz (87.454 kg)  BMI 28.07 kg/m2  LMP 04/17/2014 , BMI Body mass index is 28.07 kg/(m^2). GEN: Well nourished, well developed, in no acute distress HEENT: normal Neck: no JVD, carotid bruits, or masses Cardiac: RRR; very soft systolic  murmur,  No  rubs, or gallops,no edema  Respiratory:  clear to auscultation bilaterally, normal work of breathing GI: soft, nontender, nondistended, + BS MS: no deformity or atrophy Skin: warm and dry, no rash Neuro:  Strength and sensation are intact Psych: normal   EKG:  EKG is ordered today. The ekg ordered today demonstrates NSR at 65.  No ST or T wave changes.    Recent Labs: 10/20/2013: BUN 14; Creatinine 0.65; Hemoglobin 13.6; Platelets 160; Potassium 4.5; Sodium 140    Lipid Panel    Component Value Date/Time   CHOL 122 09/10/2011 1136   TRIG 61 09/10/2011 1136   HDL 49 09/10/2011 1136   CHOLHDL 2.5 09/10/2011 1136   VLDL 12 09/10/2011 1136   LDLCALC 61 09/10/2011 1136      Wt Readings from Last 3 Encounters:  05/25/14 192 lb 12.8 oz (87.454 kg)  05/01/14 192 lb (87.091 kg)  10/27/13 181 lb 14.4 oz (82.509 kg)      Other studies Reviewed: Additional studies/ records that were reviewed today include: . Review of the above records demonstrates:    ASSESSMENT AND PLAN:  1.  Atypical chest pain: Pamela [name] presents with some atypical chest pains. These are not related to exercise. They seem to be more related to mental stress. She's making a career change as of today. She's worked at [employer] for the past 20 years and is retiring today. She's planning on getting to the gym and working out on a regular basis.  She'll give [place] a call if she has any further episodes of chest pain  2. Mitral regurgitation. She has a very soft systolic murmur. She carries the diagnosis of mitral regurgitation and mild aortic insufficiency. We'll get an echo card gram for further evaluation of her  valve disease sees. I'll see her again in one year.  3. History of factor V Leiden deficiency: The patient has a history of DVT and was found have a factor V Leiden deficiency. She stopped her birth control pills and has not had any further episodes of thrombosis. She is followed by Dr. Martha Clan.  4. DVT:  Due to factor V Leiden deficiency. She's being followed by hematology.  Current medicines are reviewed at length with the patient today.  The patient does not have concerns regarding medicines.  The following changes have been made:  no change   Disposition:   FU with me in 1 year.      Signed, Callin Ashe, Wonda Cheng, MD  05/25/2014 10:35 AM    [address], [place], [place]  [address] Phone: [phone])  [phone]; Fax: [phone]    

## 2014-05-25 NOTE — Progress Notes (Signed)
Echo performed. 

## 2014-05-25 NOTE — Patient Instructions (Signed)
Your physician recommends that you continue on your current medications as directed. Please refer to the Current Medication list given to you today.  Your physician has requested that you have an echocardiogram. Echocardiography is a painless test that uses sound waves to create images of your heart. It provides your doctor with information about the size and shape of your heart and how well your heart's chambers and valves are working. This procedure takes approximately one hour. There are no restrictions for this procedure.  Your physician wants you to follow-up in: 1 year with Dr. Nahser.  You will receive a reminder letter in the mail two months in advance. If you don't receive a letter, please call our office to schedule the follow-up appointment.  

## 2014-07-29 NOTE — Progress Notes (Signed)
Subjective:    Patient ID: Pamela [name], female    DOB: 1970/07/24, 44 y.o.   MRN: [id number]  HPI 05/2014 cardiology note ASSESSMENT AND PLAN:  1. Atypical chest pain: Pamela [name] presents with some atypical chest pains. These are not related to exercise. They seem to be more related to mental stress. She's making a career change as of today. She's worked at [employer] for the past 20 years and is retiring today. She's planning on getting to the gym and working out on a regular basis.  She'll give [place] a call if she has any further episodes of chest pain  2. Mitral regurgitation. She has a very soft systolic murmur. She carries the diagnosis of mitral regurgitation and mild aortic insufficiency. We'll get an echo card gram for further evaluation of her valve disease sees. I'll see her again in one year.  3. History of factor V Leiden deficiency: The patient has a history of DVT and was found have a factor V Leiden deficiency. She stopped her birth control pills and has not had any further episodes of thrombosis. She is followed by Dr. Martha Clan.  4. DVT: Due to factor V Leiden deficiency. She's being followed by hematology.  Current medicines are reviewed at length with the patient today. The patient does not have concerns regarding medicines.  The following changes have been made: no change   Disposition: FU with me in 1 year.   TODAY  Pamela [name] is here for acute visit  She has third concerns  First she dropped a heavy bowl on her left foot a few weeks ago and has pain of first MTP joint when walking.    Did not seek medical attention at time of injury  Secondly she has a generalized body rash that began a few days ago.   It is similar in distribution and appearance to rash that occurred back in October when she was scratched and possible bitten on her right arm by her brother's cat.  At that time she had multiple cat scratches of right arm and axillary area.  She noted  axillary lymph node swelling and a maculopapular rash that began on her entire right arm and spread throughout body below neck.  No fever or headache at that time.  Rash resolved  She did not seek medical treatment back in the fall  4-5 days ago she noted similar generalized rash from neck below.  Associated with joint pains of both hands and ankles, both  shoulders right elbow.  Some headache last week but no fever or neck stiffness.  Of note she does sleep with her 2 dogs in her bed.  She denies tick or insect exposure  Her last concern in she has white discoloration of multiple toenails that has been chronic for her   No Known Allergies Past Medical History  Diagnosis Date  . Mitral valve disorders     with regurgitation  . Mitral valve insufficiency and aortic valve insufficiency   . Palpitations   . Migraine headache   . Factor V Leiden   . DVT (deep venous thrombosis)   . Von Willebrand disease   . Anxiety     Adjustment disorder with anxious/depressed mood  . PONV (postoperative nausea and vomiting)   . History of blood clots   . Family history of anesthesia complication    Past Surgical History  Procedure Laterality Date  . Mandible fracture surgery    . Parotidectomy Right 10/27/2013  superficial   on right           Dr Constance Holster  . Parotidectomy Right 10/27/2013    Procedure: RIGHT SUPERFICIAL PAROTIDECTOMY;  Surgeon: Izora Gala, MD;  Location: [place];  Service: ENT;  Laterality: Right;   History   Social History  . Marital Status: Legally Separated    Spouse Name: N/A  . Number of Children: N/A  . Years of Education: college   Occupational History  . ACCOUNTANT    Social History Main Topics  . Smoking status: Never Smoker   . Smokeless tobacco: Never Used     Comment: never used tobacco  . Alcohol Use: 1.0 - 1.5 oz/week    2-3 Standard drinks or equivalent per week  . Drug Use: No  . Sexual Activity:    Partners: Male    Birth Control/ Protection: None    Other Topics Concern  . Not on file   Social History Narrative   Family History  Problem Relation Age of Onset  . Mitral valve prolapse Mother   . Heart disease Mother   . Mitral valve prolapse Maternal Grandfather   . Stroke Maternal Grandfather   . Cancer Maternal Grandmother     lung   Patient Active Problem List   Diagnosis Date Noted  . 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 Anxiety 09/10/2011  . Factor V Leiden   . DVT (deep venous thrombosis)   . Migraine headache   . Von Willebrand disease   . MITRAL REGURGITATION, MILD 03/06/2009  . MITRAL VALVE PROLAPSE 03/06/2009  . PALPITATIONS, HX OF 03/06/2009  . MIGRAINES, HX OF 03/06/2009   Current Outpatient Prescriptions on File Prior to Visit  Medication Sig Dispense Refill  . LORazepam (ATIVAN) 0.5 MG tablet Take 1 tablet (0.5 mg total) by mouth 2 (two) times daily as needed for anxiety. (Patient not taking: Reported on 05/25/2014) 30 tablet 1   Current Facility-Administered Medications on File Prior to Visit  Medication Dose Route Frequency Provider Last Rate Last Dose  . TDaP (BOOSTRIX) injection 0.5 mL  0.5 mL Intramuscular Once Lanice Shirts, MD           Review of Systems    see HPI Objective:   Physical Exam Physical Exam  Nursing note and vitals reviewed.  Constitutional: She is oriented to person, place, and time. She appears well-developed and well-nourished.  HENT:  Head: Normocephalic and atraumatic.  Cardiovascular: Normal rate and regular rhythm. Exam reveals no gallop and no friction rub.  No murmur heard.  Pulmonary/Chest: Breath sounds normal. She has no wheezes. She has no rales.  Neurological: She is alert and oriented to person, place, and time. No nuchal rigidity   M/S  I see no active synovitis of any of her joints.   She is point tender over first MTP joint right foot where injury occurred.  Tender over epicondylar area of right elbow Skin: Skin is warm and dry. 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 Her behavior is normal.          Assessment & Plan:  Diffuse maculopapular rash with headache:  Etiology unclear but with exposure to outdoor pets who sleep in bed with her will empirically treat with 10 day course of doxycycline.    Unclear if arthralgias may be related to piror Bartonella infection with cat injury a few months ago but  will check Bartonella Ab, CBC, and inflammatory indicators with ANA.     Advised to make appt with her dermatologist and I willl discuss with derm.    Further management based on results   Left  Foot injury  Will get imaging today     Addendum  7:30 pm  Discussed with Dr. Fontaine No pt's dermatologist who will see pt this week .

## 2014-07-30 ENCOUNTER — Ambulatory Visit (INDEPENDENT_AMBULATORY_CARE_PROVIDER_SITE_OTHER): Payer: Self-pay | Admitting: Internal Medicine

## 2014-07-30 ENCOUNTER — Encounter: Payer: Self-pay | Admitting: Internal Medicine

## 2014-07-30 ENCOUNTER — Ambulatory Visit (HOSPITAL_BASED_OUTPATIENT_CLINIC_OR_DEPARTMENT_OTHER)
Admission: RE | Admit: 2014-07-30 | Discharge: 2014-07-30 | Disposition: A | Discharge status: Home / Self Care | Payer: Self-pay | Source: Ambulatory Visit | Attending: Internal Medicine | Admitting: Internal Medicine

## 2014-07-30 VITALS — BP 122/79 | HR 85 | Resp 16 | Ht 70.0 in | Wt 196.0 lb

## 2014-07-30 DIAGNOSIS — M79672 Pain in left foot: Secondary | ICD-10-CM | POA: Insufficient documentation

## 2014-07-30 DIAGNOSIS — S99922A Unspecified injury of left foot, initial encounter: Secondary | ICD-10-CM

## 2014-07-30 DIAGNOSIS — M25521 Pain in right elbow: Secondary | ICD-10-CM | POA: Insufficient documentation

## 2014-07-30 DIAGNOSIS — M2550 Pain in unspecified joint: Secondary | ICD-10-CM

## 2014-07-30 LAB — CBC WITH DIFFERENTIAL/PLATELET
BASOS PCT: 1 % (ref 0–1)
Basophils Absolute: 0 10*3/uL (ref 0.0–0.1)
Eosinophils Absolute: 0.1 10*3/uL (ref 0.0–0.7)
Eosinophils Relative: 2 % (ref 0–5)
HEMATOCRIT: 40.1 % (ref 36.0–46.0)
HEMOGLOBIN: 13.3 g/dL (ref 12.0–15.0)
LYMPHS ABS: 1.2 10*3/uL (ref 0.7–4.0)
Lymphocytes Relative: 24 % (ref 12–46)
MCH: 28.3 pg (ref 26.0–34.0)
MCHC: 33.2 g/dL (ref 30.0–36.0)
MCV: 85.3 fL (ref 78.0–100.0)
MONOS PCT: 7 % (ref 3–12)
MPV: 10.3 fL (ref 8.6–12.4)
Monocytes Absolute: 0.3 10*3/uL (ref 0.1–1.0)
NEUTROS ABS: 3.2 10*3/uL (ref 1.7–7.7)
NEUTROS PCT: 66 % (ref 43–77)
Platelets: 179 10*3/uL (ref 150–400)
RBC: 4.7 MIL/uL (ref 3.87–5.11)
RDW: 14.1 % (ref 11.5–15.5)
WBC: 4.9 10*3/uL (ref 4.0–10.5)

## 2014-07-30 MED ORDER — DOXYCYCLINE HYCLATE 100 MG PO TABS: 100.0000 mg | ORAL_TABLET | Freq: Two times a day (BID) | ORAL | Status: DC

## 2014-07-31 ENCOUNTER — Telehealth: Payer: Self-pay | Admitting: *Deleted

## 2014-07-31 LAB — COMPREHENSIVE METABOLIC PANEL
ALBUMIN: 4.1 g/dL (ref 3.5–5.2)
ALK PHOS: 56 U/L (ref 39–117)
ALT: 29 U/L (ref 0–35)
AST: 29 U/L (ref 0–37)
BUN: 16 mg/dL (ref 6–23)
CHLORIDE: 103 meq/L (ref 96–112)
CO2: 26 mEq/L (ref 19–32)
CREATININE: 0.64 mg/dL (ref 0.50–1.10)
Calcium: 9.5 mg/dL (ref 8.4–10.5)
Glucose, Bld: 91 mg/dL (ref 70–99)
POTASSIUM: 4.8 meq/L (ref 3.5–5.3)
Sodium: 137 mEq/L (ref 135–145)
Total Bilirubin: 0.5 mg/dL (ref 0.2–1.2)
Total Protein: 7 g/dL (ref 6.0–8.3)

## 2014-07-31 LAB — ANA: ANA: NEGATIVE

## 2014-07-31 LAB — RHEUMATOID FACTOR

## 2014-07-31 LAB — SEDIMENTATION RATE: Sed Rate: 4 mm/hr (ref 0–20)

## 2014-07-31 NOTE — Telephone Encounter (Signed)
I spoke with Pamela [name] in regards to her x-ray results- eh

## 2014-07-31 NOTE — Telephone Encounter (Signed)
-----   Message from Lanice Shirts, MD sent at 07/30/2014  3:02 PM EDT ----- Call Pamela [name] and let her know that the foot xray shows no fracture and the joints in her elbow xray and foot xray look normal.  Let her know I am waiting to hear back from dermatologist   Ok to mail xray results to pt if she wishes

## 2014-08-01 LAB — BARTONELLA ANTIBODY PANEL

## 2014-08-01 LAB — BARTONELLA ANITBODY PANEL
B henselae IgG: NEGATIVE
B henselae IgM: NEGATIVE

## 2014-08-02 ENCOUNTER — Telehealth: Payer: Self-pay | Admitting: Internal Medicine

## 2014-08-02 NOTE — Telephone Encounter (Signed)
Spoke with pt and informed of all lab results    She had seen dermatologist today who felt that pt had contact dermatitis .  Pt states rash is resolved

## 2014-08-07 ENCOUNTER — Encounter: Payer: Self-pay | Admitting: *Deleted

## 2015-01-07 ENCOUNTER — Telehealth: Payer: Self-pay | Admitting: Cardiovascular Disease

## 2015-01-07 NOTE — Telephone Encounter (Signed)
ERROR

## 2015-02-13 ENCOUNTER — Encounter: Payer: Self-pay | Admitting: *Deleted

## 2015-02-19 ENCOUNTER — Ambulatory Visit (INDEPENDENT_AMBULATORY_CARE_PROVIDER_SITE_OTHER): Payer: BLUE CROSS/BLUE SHIELD | Admitting: Cardiology

## 2015-02-19 ENCOUNTER — Encounter: Payer: Self-pay | Admitting: Cardiology

## 2015-02-19 VITALS — BP 124/84 | HR 67 | Ht 70.0 in | Wt 198.0 lb

## 2015-02-19 DIAGNOSIS — D6851 Activated protein C resistance: Secondary | ICD-10-CM | POA: Diagnosis not present

## 2015-02-19 DIAGNOSIS — I080 Rheumatic disorders of both mitral and aortic valves: Secondary | ICD-10-CM

## 2015-02-19 DIAGNOSIS — Z8679 Personal history of other diseases of the circulatory system: Secondary | ICD-10-CM

## 2015-02-19 DIAGNOSIS — D6800 Von Willebrand disease, unspecified: Secondary | ICD-10-CM

## 2015-02-19 NOTE — Patient Instructions (Signed)
Your physician recommends that you continue on your current medications as directed. Please refer to the Current Medication list given to you today.  Your physician wants you to follow-up in: 1 year with Dr. Skains. You will receive a reminder letter in the mail two months in advance. If you don't receive a letter, please call our office to schedule the follow-up appointment.  

## 2015-02-19 NOTE — Progress Notes (Signed)
Cardiology Office Note   Date:  02/19/2015   ID:  Pamela [name], DOB Aug 22, 1970, MRN [id number]  PCP:  Kelton Pillar, MD  Cardiologist:   Candee Furbish, MD former Dr. Acie Fredrickson, [place]      History of Present Illness: Pamela [name] is a 44 y.o. female here for follow-up of mitral regurgitation, factor V Leiden deficiency, von Willebrand syndrome and history of DVT. She is to see Dr. Mar Daring.  Both her brother and mother have had mitral valve repair. In 2003 she had a DVT while on oral contraceptives. She took Coumadin for 3 months. Had challenges in getting INR regulated. She worked at [employer].retiring  Distant past had palpitations with steroid treatment for headache. Dr. Verl Blalock - Propanolol. Dr. Leonides Schanz. Came off after 6 mts. Gained weight.   160 pounds is goal. Notices when hikes. Palps. Knows nothing will happen. No high risk symptoms of syncope, bleeding, orthopnea, PND.  She does note that she's had both a clotting disorder as well as a bleeding disorder. Prior to jaw surgery she had seen hematology.   Lolita Cram (my patient) MV repair. AFIB. Runs.   Parotid tumor OK Jaw surgery    Past Medical History  Diagnosis Date  . Mitral valve disorders     with regurgitation  . Mitral valve insufficiency and aortic valve insufficiency   . Palpitations   . Migraine headache   . Factor V Leiden ([place])   . DVT (deep venous thrombosis) ([place])   . Von Willebrand disease ([place])   . Anxiety     Adjustment disorder with anxious/depressed mood  . PONV (postoperative nausea and vomiting)   . History of blood clots   . Family history of anesthesia complication     Past Surgical History  Procedure Laterality Date  . Mandible fracture surgery    . Parotidectomy Right 10/27/2013    superficial   on right           Dr Constance Holster  . Parotidectomy Right 10/27/2013    Procedure: RIGHT SUPERFICIAL PAROTIDECTOMY;  Surgeon: Izora Gala, MD;  Location: [place];  Service: ENT;   Laterality: Right;     Current Outpatient Prescriptions  Medication Sig Dispense Refill  . clonazePAM (KLONOPIN) 0.5 MG tablet Take 0.5 mg by mouth daily as needed for anxiety.    . Multiple Vitamin (MULTIVITAMIN) capsule Take 1 capsule by mouth daily.    . Naproxen Sodium 220 MG CAPS Take by mouth.    . Omega-3 Fatty Acids (FISH OIL) 1000 MG CAPS Take by mouth.     Current Facility-Administered Medications  Medication Dose Route Frequency Provider Last Rate Last Dose  . TDaP (BOOSTRIX) injection 0.5 mL  0.5 mL Intramuscular Once Lanice Shirts, MD        Allergies:   Review of patient's allergies indicates no known allergies.    Social History:  The patient  reports that she has never smoked. She has never used smokeless tobacco. She reports that she drinks about 1.0 - 1.5 oz of alcohol per week. She reports that she does not use illicit drugs.   Family History:  The patient's family history includes Cancer in her maternal grandmother; Heart disease in her mother; Mitral valve prolapse in her maternal grandfather and mother; Stroke in her maternal grandfather.    ROS:  Please see the history of present illness.   Otherwise, review of systems are positive for none.  Occasional anxiety. All other systems are reviewed  and negative.    PHYSICAL EXAM: VS:  BP 124/84 mmHg  Pulse 67  Ht 5\' 10"  (1.778 m)  Wt 198 lb (89.812 kg)  BMI 28.41 kg/m2  SpO2 99%  LMP 02/09/2015 , BMI Body mass index is 28.41 kg/(m^2). GEN: Well nourished, well developed, in no acute distress HEENT: normal Neck: no JVD, carotid bruits, or masses Cardiac: RRR; soft systolic murmur LLSB, no rubs, or gallops, no edema  Respiratory:  clear to auscultation bilaterally, normal work of breathing GI: soft, nontender, nondistended, + BS MS: no deformity or atrophy Skin: warm and dry, no rash Neuro:  Strength and sensation are intact Psych: euthymic mood, full affect   EKG: prior EKG demonstrates sinus  rhythm 65 with no ST segment changes.   Recent Labs: 07/30/2014: ALT 29; BUN 16; Creat 0.64; Hemoglobin 13.3; Platelets 179; Potassium 4.8; Sodium 137    Lipid Panel    Component Value Date/Time   CHOL 122 09/10/2011 1136   TRIG 61 09/10/2011 1136   HDL 49 09/10/2011 1136   CHOLHDL 2.5 09/10/2011 1136   VLDL 12 09/10/2011 1136   LDLCALC 61 09/10/2011 1136      Wt Readings from Last 3 Encounters:  02/19/15 198 lb (89.812 kg)  07/30/14 196 lb (88.905 kg)  05/25/14 192 lb 12.8 oz (87.454 kg)    ECHO: 05/25/14 - Left ventricle: The cavity size was normal. Systolic function was normal. The estimated ejection fraction was in the range of 55%to 60%. Wall motion was normal; there were no regional wallmotion abnormalities. Doppler parameters are consistent withabnormal left ventricular relaxation (grade 1 diastolicdysfunction). There was no evidence of elevated ventricularfilling pressure by Doppler parameters. - Aortic valve: Transvalvular velocity was within the normal range. There was no stenosis. There was no regurgitation. - Aortic root: The aortic root was normal in size. - Ascending aorta: The ascending aorta was normal in size. - Mitral valve: Structurally normal valve. There was mild regurgitation. - Left atrium: The atrium was normal in size. - Right ventricle: Systolic function was normal. - Right atrium: The atrium was normal in size. - Atrial septum: No defect or patent foramen ovale was identified. - Tricuspid valve: Structurally normal valve. There was trivial regurgitation. - Pulmonic valve: Structurally normal valve. - Pulmonary arteries: Systolic pressure was within the normalrange. - Inferior vena cava: The vessel was normal in size. - Pericardium, extracardiac: There was no pericardial effusion.  Other studies Reviewed: Additional studies/ records that were reviewed today include: prior lab work, echocardiogram reviewed. Review of the above  records demonstrates: as above   ASSESSMENT AND PLAN:  1.  Prior atypical chest pain  - Mostly related to mental stress surrounding her retirement. Doing better.  - took naproxen with last episode that was under the left rib cage and it slowly subsided. She is not experiencing any exertional chest discomfort.  2. Mitral regurgitation  - Very soft systolic murmur.  - mild MR on echocardiogram 05/25/14. No aortic regurgitation. Normal EF.  - her mother, Pragna Schlinger as well as her uncle Marden Noble) both had mitral valve repairs.  3. History of factor V Leiden deficiency and von Willebrand's disorder  - Previously saw Dr. Marin Olp  - No further episodes of DVT after stopping oral contraceptives.  - she mentioned that it was bizarre that she had both a clotting disorder and a bleeding disorder. She took a bleeding test that was exceedingly long bleeding time.  4. History of DVT - Leiden deficiency-followed in the past by  hematology.  5. Palpitations  - She hasn't had any severe episodes recently. In the past she used to feel her heart pounding for minutes at a time. Sometimes they're anxiety related.  - Used to be on propranolol for a short while. Does not wish to take any beta blockers at this time. Offered her when necessary beta blocker if necessary.  6. Encourage weight loss, hiking, she was walking 8 miles a day when she was fully retired. She is now working once again for a [employer] on [employer].   Current medicines are reviewed at length with the patient today.  The patient does not have concerns regarding medicines.  The following changes have been made:  no change  Labs/ tests ordered today include: none  No orders of the defined types were placed in this encounter.     Disposition:   FU with Aneya Daddona in one year.  Bobby Rumpf, MD  02/19/2015 12:43 PM    [facility] [address], [place], [place]  [address] Phone: [phone]; Fax:  [phone]

## 2015-07-01 ENCOUNTER — Other Ambulatory Visit: Payer: Self-pay | Admitting: Internal Medicine

## 2015-07-01 DIAGNOSIS — Z1231 Encounter for screening mammogram for malignant neoplasm of breast: Secondary | ICD-10-CM

## 2015-07-04 ENCOUNTER — Ambulatory Visit
Admission: RE | Admit: 2015-07-04 | Discharge: 2015-07-04 | Disposition: A | Discharge status: Home / Self Care | Payer: BLUE CROSS/BLUE SHIELD | Source: Ambulatory Visit | Attending: Internal Medicine | Admitting: Internal Medicine

## 2015-07-04 DIAGNOSIS — Z1231 Encounter for screening mammogram for malignant neoplasm of breast: Secondary | ICD-10-CM

## 2015-10-24 ENCOUNTER — Ambulatory Visit: Payer: BLUE CROSS/BLUE SHIELD | Admitting: Physician Assistant

## 2017-01-09 IMAGING — CR DG FOOT COMPLETE 3+V*L*
3 series · 3 of 3 positions shown · non-contrast
Comparison: None.

CLINICAL DATA: Left foot injury, dropped bowl on left foot,
swelling along 1st metatarsal head, plantar foot pain

EXAM:
LEFT FOOT - COMPLETE 3+ VIEW

[t foot ap left]
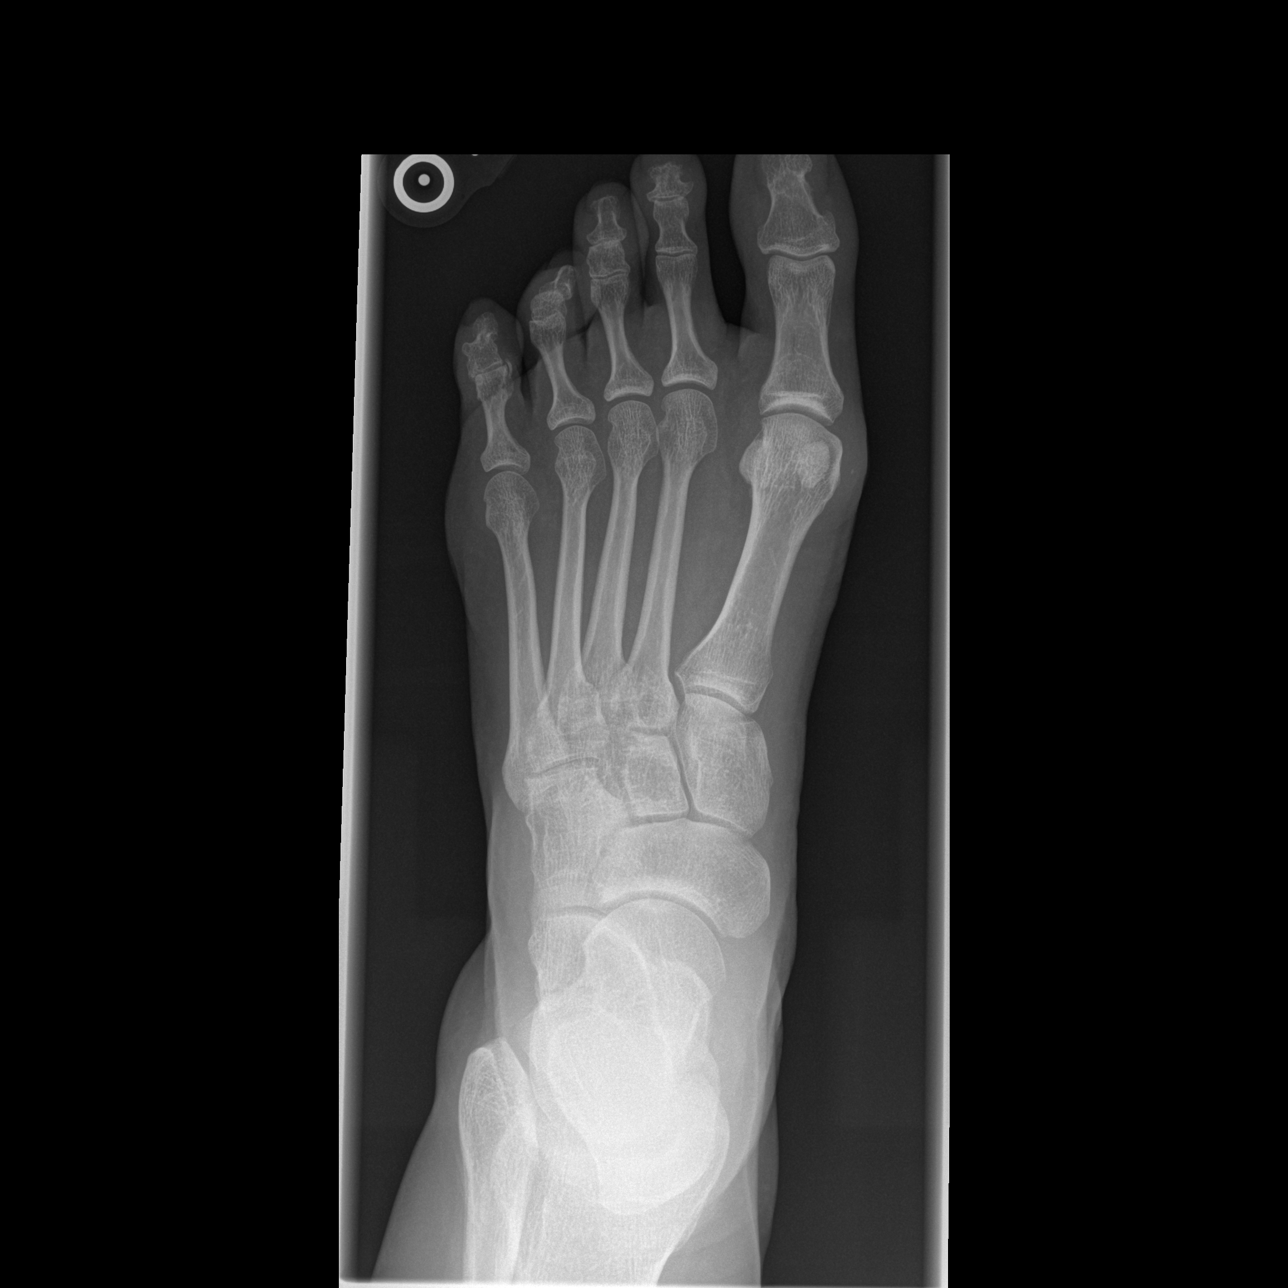

[t foot oblique left]
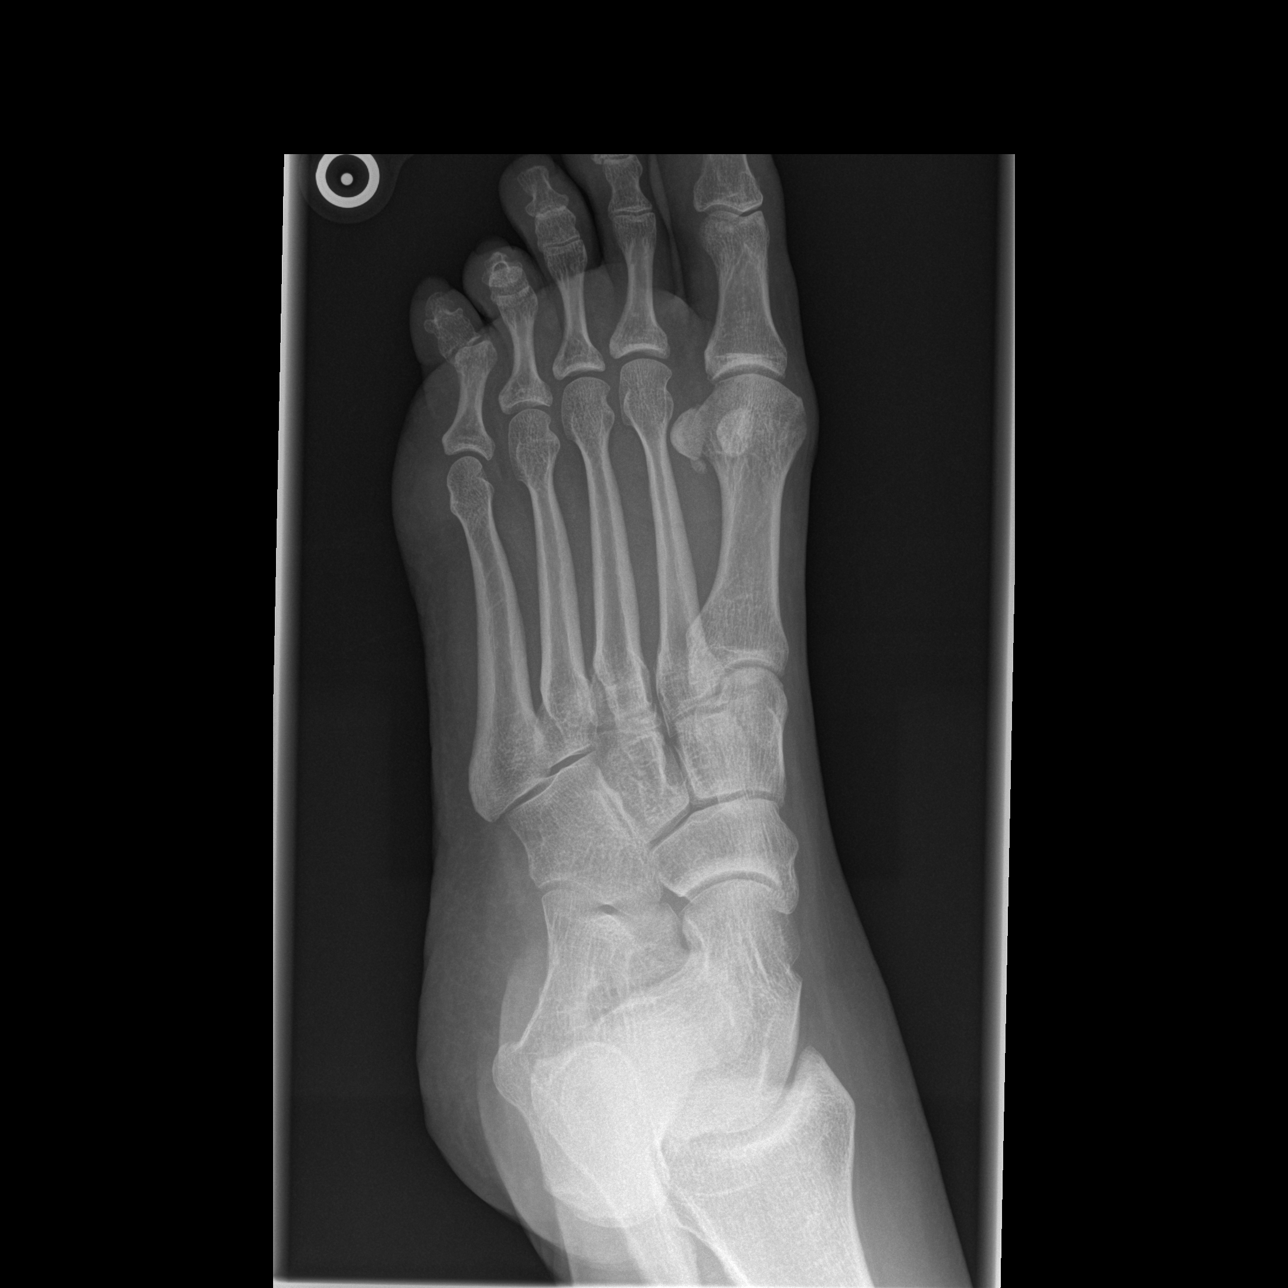

[t foot lat left]
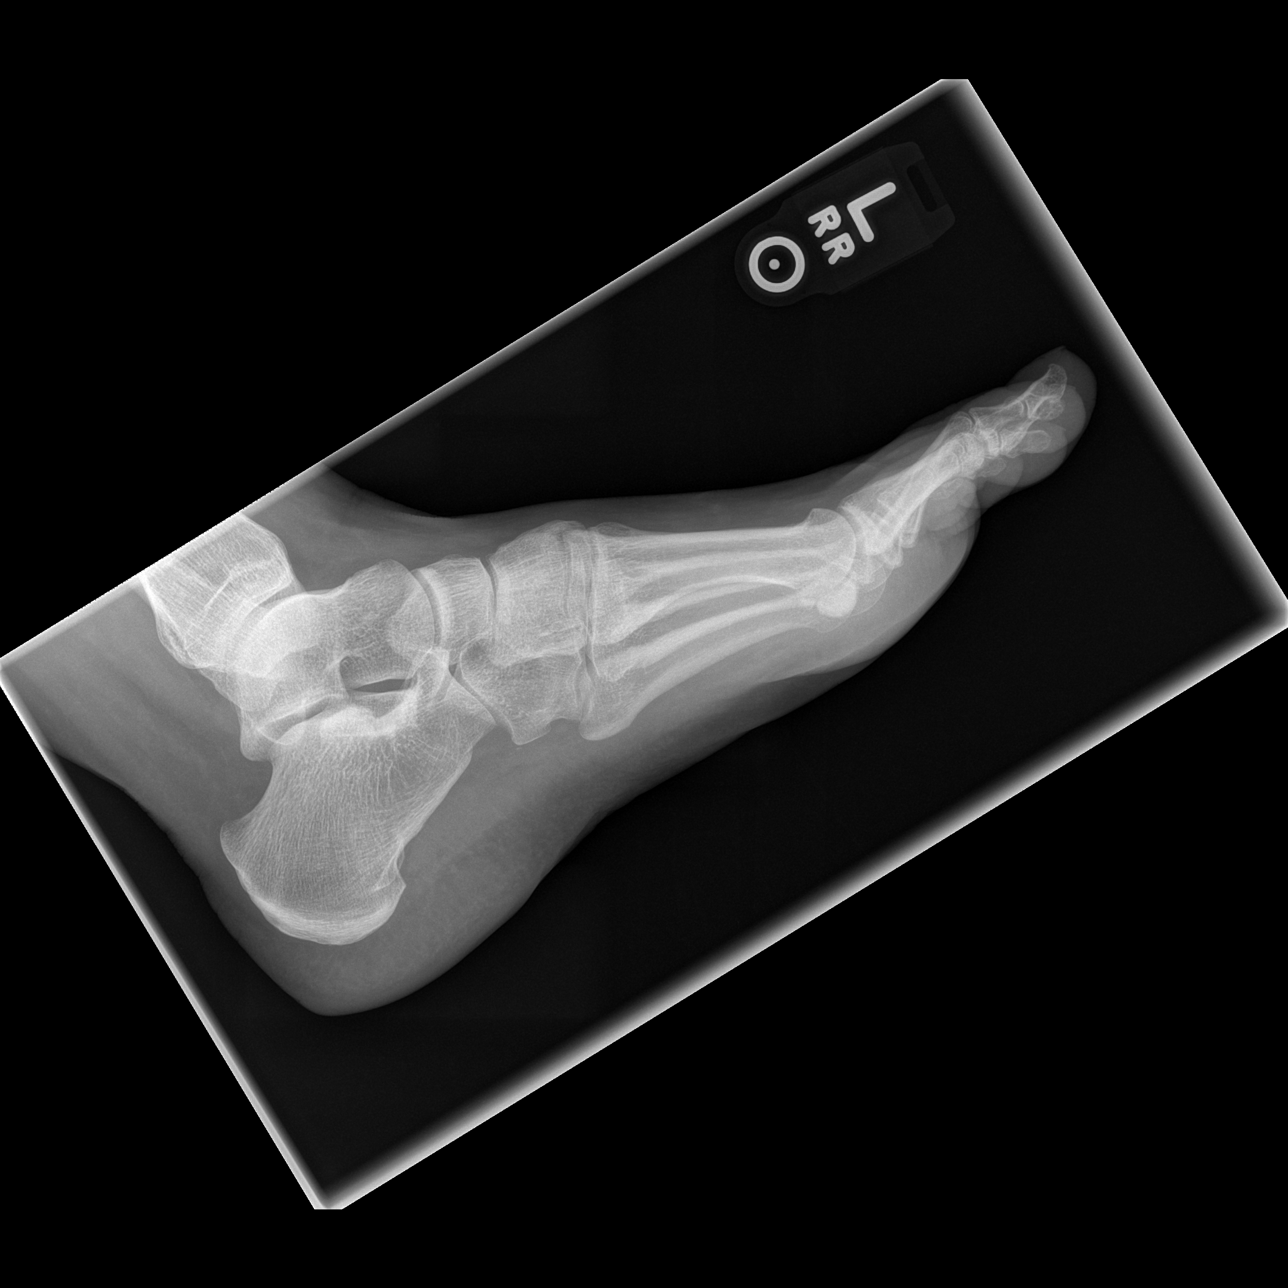

[3 of 3 positions shown; findings below may reference images not displayed]

FINDINGS: No fracture or dislocation is seen.

The joint spaces are preserved.

Visualized soft tissues are within normal limits.
IMPRESSION: No fracture or dislocation is seen.

## 2018-12-26 ENCOUNTER — Other Ambulatory Visit: Payer: Self-pay

## 2018-12-26 DIAGNOSIS — Z20822 Contact with and (suspected) exposure to covid-19: Secondary | ICD-10-CM

## 2018-12-28 LAB — NOVEL CORONAVIRUS, NAA: SARS-CoV-2, NAA: NOT DETECTED

## 2019-08-10 ENCOUNTER — Ambulatory Visit: Payer: Self-pay | Attending: Internal Medicine

## 2019-08-10 DIAGNOSIS — Z23 Encounter for immunization: Secondary | ICD-10-CM

## 2019-08-10 NOTE — Progress Notes (Signed)
   Covid-19 Vaccination Clinic  Name:  Pamela [name]    MRN: [id number] DOB: 30-Mar-1970  08/10/2019  [name] was observed post Covid-19 immunization for 15 minutes without incident. She was provided with Vaccine Information Sheet and instruction to access the V-Safe system.   [name] was instructed to call 911 with any severe reactions post vaccine: Marland Kitchen Difficulty breathing  . Swelling of face and throat  . A fast heartbeat  . A bad rash all over body  . Dizziness and weakness   Immunizations Administered    Name Date Dose VIS Date Route   Pfizer COVID-19 Vaccine 08/10/2019  9:35 AM 0.3 mL 05/17/2018 Intramuscular   Manufacturer: [employer], [employer]   Lot: [id number]   [place]: [id number]

## 2019-09-04 ENCOUNTER — Ambulatory Visit: Payer: Self-pay | Attending: Internal Medicine

## 2019-09-04 DIAGNOSIS — Z23 Encounter for immunization: Secondary | ICD-10-CM

## 2019-09-04 NOTE — Progress Notes (Signed)
° °  Covid-19 Vaccination Clinic  Name:  Pamela [name]    MRN: [id number] DOB: 1970/05/11  09/04/2019  Pamela [name] was observed post Covid-19 immunization for 15 minutes without incident. She was provided with Vaccine Information Sheet and instruction to access the V-Safe system.   Pamela [name] was instructed to call 911 with any severe reactions post vaccine:  Difficulty breathing   Swelling of face and throat   A fast heartbeat   A bad rash all over body   Dizziness and weakness   Immunizations Administered    Name Date Dose VIS Date Route   Pfizer COVID-19 Vaccine 09/04/2019  8:39 AM 0.3 mL 05/17/2018 Intramuscular   Manufacturer: [place]   Lot: [id number]   [place]: [id number]

## 2020-07-12 DIAGNOSIS — J09X2 Influenza due to identified novel influenza A virus with other respiratory manifestations: Secondary | ICD-10-CM | POA: Diagnosis not present

## 2020-09-04 DIAGNOSIS — L280 Lichen simplex chronicus: Secondary | ICD-10-CM | POA: Diagnosis not present

## 2020-09-04 DIAGNOSIS — C44729 Squamous cell carcinoma of skin of left lower limb, including hip: Secondary | ICD-10-CM | POA: Diagnosis not present

## 2020-09-04 DIAGNOSIS — X32XXXA Exposure to sunlight, initial encounter: Secondary | ICD-10-CM | POA: Diagnosis not present

## 2020-09-04 DIAGNOSIS — L570 Actinic keratosis: Secondary | ICD-10-CM | POA: Diagnosis not present

## 2020-10-02 DIAGNOSIS — Z85828 Personal history of other malignant neoplasm of skin: Secondary | ICD-10-CM | POA: Diagnosis not present

## 2020-10-02 DIAGNOSIS — Z08 Encounter for follow-up examination after completed treatment for malignant neoplasm: Secondary | ICD-10-CM | POA: Diagnosis not present

## 2021-09-16 ENCOUNTER — Encounter: Payer: Self-pay | Admitting: Cardiology

## 2021-09-16 ENCOUNTER — Ambulatory Visit: Payer: BC Managed Care – PPO | Admitting: Cardiology

## 2021-09-16 VITALS — BP 120/70 | HR 69 | Ht 70.0 in | Wt 224.0 lb

## 2021-09-16 DIAGNOSIS — I080 Rheumatic disorders of both mitral and aortic valves: Secondary | ICD-10-CM | POA: Diagnosis not present

## 2021-09-16 DIAGNOSIS — D6800 Von Willebrand disease, unspecified: Secondary | ICD-10-CM | POA: Diagnosis not present

## 2021-09-16 DIAGNOSIS — D6851 Activated protein C resistance: Secondary | ICD-10-CM | POA: Diagnosis not present

## 2021-09-16 NOTE — Progress Notes (Signed)
Cardiology Office Note:    Date:  09/16/2021   ID:  Pamela [name], DOB 1970/10/08, MRN [id number]  PCP:  Kendrick Ranch, MD   [facility] Providers Cardiologist:  Donato Schultz, MD     Referring MD: Kendrick Ranch, *   History of Present Illness:    Pamela [name] is a 51 y.o. female here to re-establish care.  She was last seen in clinic 02/19/2015 for the follow-up of mitral regurgitation, factor V Leiden deficiency, von Willebrand syndrome and prior DVT. Distant past had palpitations with steroid treatment for headache. Dr. Daleen Squibb - Propanolol. Dr. Uvaldo Rising. Came off after 6 mts. Gained weight. Also noticed palpitations while hiking.   In 2003 she had a DVT while on oral contraceptives. She took Coumadin for 3 months. Had challenges in getting INR regulated. She does note that she's had both a clotting disorder as well as a bleeding disorder. Prior to jaw surgery she had seen hematology.    Both her brother and mother Pamela [name], my patient) have had mitral valve repair 6 weeks apart. Her mother also had AFIB. Runs.    Parotid tumor OK  She previously worked at [employer]  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  Today, she reports no major concerns at this time.  Last year she suffered a laceration to her finger. She eventually went to the ER after 4 hours of being unable to stop the bleeding.  She continues to exercise by walking. The other day she worked for 81 minutes in the yard.  She went back to work part time; currently she is managing a Conservation officer, historic buildings.  She denies any palpitations, chest pain, shortness of breath, or peripheral edema. No lightheadedness, headaches, syncope, orthopnea, or PND.  She has an appointment with her PCP to discuss her menopausal symptoms. In a year she has gained 45 lbs and she is suffering from hot flashes.   Past Medical History:  Diagnosis Date   Anxiety    Adjustment disorder with  anxious/depressed mood   DVT (deep venous thrombosis) (HCC)    Factor V Leiden (HCC)    Family history of anesthesia complication    History of blood clots    Migraine headache    Mitral valve disorders(424.0)    with regurgitation   Mitral valve insufficiency and aortic valve insufficiency    Palpitations    PONV (postoperative nausea and vomiting)    Von Willebrand disease (HCC)     Past Surgical History:  Procedure Laterality Date   MANDIBLE FRACTURE SURGERY     PAROTIDECTOMY Right 10/27/2013   superficial   on right           Dr Pollyann Kennedy   PAROTIDECTOMY Right 10/27/2013   Procedure: RIGHT SUPERFICIAL PAROTIDECTOMY;  Surgeon: Serena Colonel, MD;  [facility];  Service: ENT;  Laterality: Right;    Current Medications: Current Meds  Medication Sig   Naproxen Sodium 220 MG CAPS Take by mouth.   Current Facility-Administered Medications for the 09/16/21 encounter (Office Visit) with Jake Bathe, MD  Medication   TDaP (BOOSTRIX) injection 0.5 mL     Allergies:   Patient has no known allergies.   Social History   Socioeconomic History   Marital status: Single    Spouse name: Not on file   Number of children: Not on file   Years of education: college   Highest education level: Not on file  Occupational History  Occupation: Agricultural engineer: [employer]  Tobacco Use   Smoking status: Never   Smokeless tobacco: Never   Tobacco comments:    never used tobacco  Substance and Sexual Activity   Alcohol use: Yes    Alcohol/week: 2.0 - 3.0 standard drinks of alcohol    Types: 2 - 3 Standard drinks or equivalent per week   Drug use: No   Sexual activity: Yes    Partners: Male    Birth control/protection: None  Other Topics Concern   Not on file  Social History Narrative   Not on file   Social Determinants of Health   Financial Resource Strain: Not on file  Food Insecurity: Not on file  Transportation Needs: Not on file  Physical Activity: Not on file   Stress: Not on file  Social Connections: Not on file     Family History: The patient's family history includes Cancer in her maternal grandmother; Heart disease in her mother; Mitral valve prolapse in her maternal grandfather and mother; Stroke in her maternal grandfather.  ROS:   Please see the history of present illness.    All other systems reviewed and are negative.  EKGs/Labs/Other Studies Reviewed:    The following studies were reviewed today:  ECHO: 05/25/14 - Left ventricle: The cavity size was normal. Systolic function was normal. The estimated ejection fraction was in the range of 55%  to 60%. Wall motion was normal; there were no regional wall  motion abnormalities. Doppler parameters are consistent with  abnormal left ventricular relaxation (grade 1 diastolic  dysfunction). There was no evidence of elevated ventricular  filling pressure by Doppler parameters. - Aortic valve: Transvalvular velocity was within the normal range.   There was no stenosis. There was no regurgitation. - Aortic root: The aortic root was normal in size. - Ascending aorta: The ascending aorta was normal in size. - Mitral valve: Structurally normal valve. There was mild   regurgitation. - Left atrium: The atrium was normal in size. - Right ventricle: Systolic function was normal. - Right atrium: The atrium was normal in size. - Atrial septum: No defect or patent foramen ovale was identified. - Tricuspid valve: Structurally normal valve. There was trivial   regurgitation. - Pulmonic valve: Structurally normal valve. - Pulmonary arteries: Systolic pressure was within the normal  range. - Inferior vena cava: The vessel was normal in size. - Pericardium, extracardiac: There was no pericardial effusion.   EKG:  EKG is personally reviewed and interpreted. 09/16/2021: Sinus rhythm. Rate 69 bpm. Possible left atrial enlargement. Prior EKG demonstrates sinus rhythm 65 with no ST segment  changes.   Recent Labs: No results found for requested labs within last 365 days.   Recent Lipid Panel    Component Value Date/Time   CHOL 122 09/10/2011 1136   TRIG 61 09/10/2011 1136   HDL 49 09/10/2011 1136   CHOLHDL 2.5 09/10/2011 1136   VLDL 12 09/10/2011 1136   LDLCALC 61 09/10/2011 1136     Risk Assessment/Calculations:          Physical Exam:    VS:  BP 120/70 (BP Location: Left Arm, Patient Position: Sitting, Cuff Size: Large)   Pulse 69   Ht 5\' 10"  (1.778 m)   Wt 224 lb (101.6 kg)   SpO2 96%   BMI 32.14 kg/m     Wt Readings from Last 3 Encounters:  09/16/21 224 lb (101.6 kg)  02/19/15 198 lb (89.8 kg)  07/30/14 196 lb (  88.9 kg)     GEN: Well nourished, well developed in no acute distress HEENT: Normal NECK: No JVD; No carotid bruits LYMPHATICS: No lymphadenopathy CARDIAC: RRR, soft systolic murmur, no rubs, no gallops RESPIRATORY:  Clear to auscultation without rales, wheezing or rhonchi  ABDOMEN: Soft, non-tender, non-distended MUSCULOSKELETAL:  No edema; No deformity  SKIN: Warm and dry NEUROLOGIC:  Alert and oriented x 3 PSYCHIATRIC:  Normal affect   ASSESSMENT:    1. MITRAL REGURGITATION, MILD   2. Factor V Leiden (HCC)   3. Von Willebrand disease (HCC)    PLAN:    In order of problems listed above:  MITRAL REGURGITATION, MILD Previous echo in 2016 showed mild mitral vegetation.  Both her mother and brother have had mitral valve repairs.  We will continue to monitor.  Check echocardiogram.  Soft systolic murmur heard on exam.  No shortness of breath, no chest pain.  No significant palpitations.  She previously was on propranolol when she saw Dr. Daleen Squibb in the past.  No longer needs.  Has been battling menopause, hot flash etc.  She is seeing a new primary care physician soon.  Factor V Leiden Dr. Myna Hidalgo has seen her in the past.  She also has von Willebrand's disease.  Von Willebrand disease Dr. Myna Hidalgo has seen her in the past she also  has factor V Leiden.       Follow-up: 2 years.  Medication Adjustments/Labs and Tests Ordered: Current medicines are reviewed at length with the patient today.  Concerns regarding medicines are outlined above.   Orders Placed This Encounter  Procedures   EKG 12-Lead   ECHOCARDIOGRAM COMPLETE   No orders of the defined types were placed in this encounter.  Patient Instructions  Medication Instructions:  The current medical regimen is effective;  continue present plan and medications.  *If you need a refill on your cardiac medications before your next appointment, please call your pharmacy*  Testing/Procedures: Your physician has requested that you have an echocardiogram. Echocardiography is a painless test that uses sound waves to create images of your heart. It provides your doctor with information about the size and shape of your heart and how well your heart's chambers and valves are working. This procedure takes approximately one hour. There are no restrictions for this procedure.    Follow-Up: At [facility], you and your health needs are our priority.  As part of our continuing mission to provide you with exceptional heart care, we have created designated Provider Care Teams.  These Care Teams include your primary Cardiologist (physician) and Advanced Practice Providers (APPs -  Physician Assistants and Nurse Practitioners) who all work together to provide you with the care you need, when you need it.  We recommend signing up for the patient portal called "MyChart".  Sign up information is provided on this After Visit Summary.  MyChart is used to connect with patients for Virtual Visits (Telemedicine).  Patients are able to view lab/test results, encounter notes, upcoming appointments, etc.  Non-urgent messages can be sent to your provider as well.   To learn more about what you can do with MyChart, go to [email or web address].    Your next appointment:   2  year(s)  The format for your next appointment:   In Person  Provider:   Dr Donato Schultz   Important Information About Sugar         I,Mathew Stumpf,acting as a scribe for Donato Schultz, MD.,have documented all relevant documentation on the behalf  of Donato Schultz, MD,as directed by  Donato Schultz, MD while in the presence of Donato Schultz, MD.  I, Donato Schultz, MD, have reviewed all documentation for this visit. The documentation on 09/16/21 for the exam, diagnosis, procedures, and orders are all accurate and complete.   Signed, Donato Schultz, MD  09/16/2021 4:27 PM    [facility]

## 2021-09-16 NOTE — Assessment & Plan Note (Signed)
Previous echo in 2016 showed mild mitral vegetation.  Both her mother and brother have had mitral valve repairs.  We will continue to monitor.  Check echocardiogram.  Soft systolic murmur heard on exam.  No shortness of breath, no chest pain.  No significant palpitations.  She previously was on propranolol when she saw Dr. Daleen Squibb in the past.  No longer needs.  Has been battling menopause, hot flash etc.  She is seeing a new primary care physician soon.

## 2021-09-30 ENCOUNTER — Encounter: Payer: Self-pay | Admitting: Family

## 2021-09-30 ENCOUNTER — Ambulatory Visit: Payer: BC Managed Care – PPO | Admitting: Family

## 2021-09-30 ENCOUNTER — Other Ambulatory Visit (HOSPITAL_BASED_OUTPATIENT_CLINIC_OR_DEPARTMENT_OTHER): Payer: Self-pay

## 2021-09-30 VITALS — BP 110/70 | HR 71 | Temp 98.2°F | Resp 18 | Ht 70.0 in | Wt 225.6 lb

## 2021-09-30 DIAGNOSIS — E010 Iodine-deficiency related diffuse (endemic) goiter: Secondary | ICD-10-CM

## 2021-09-30 DIAGNOSIS — I080 Rheumatic disorders of both mitral and aortic valves: Secondary | ICD-10-CM

## 2021-09-30 DIAGNOSIS — R739 Hyperglycemia, unspecified: Secondary | ICD-10-CM

## 2021-09-30 DIAGNOSIS — Z23 Encounter for immunization: Secondary | ICD-10-CM

## 2021-09-30 DIAGNOSIS — R232 Flushing: Secondary | ICD-10-CM

## 2021-09-30 DIAGNOSIS — D6851 Activated protein C resistance: Secondary | ICD-10-CM

## 2021-09-30 DIAGNOSIS — R635 Abnormal weight gain: Secondary | ICD-10-CM | POA: Diagnosis not present

## 2021-09-30 DIAGNOSIS — Z1231 Encounter for screening mammogram for malignant neoplasm of breast: Secondary | ICD-10-CM

## 2021-09-30 DIAGNOSIS — Z1211 Encounter for screening for malignant neoplasm of colon: Secondary | ICD-10-CM

## 2021-09-30 DIAGNOSIS — Z820 Family history of epilepsy and other diseases of the nervous system: Secondary | ICD-10-CM | POA: Diagnosis not present

## 2021-09-30 DIAGNOSIS — D6800 Von Willebrand disease, unspecified: Secondary | ICD-10-CM

## 2021-09-30 DIAGNOSIS — D490 Neoplasm of unspecified behavior of digestive system: Secondary | ICD-10-CM

## 2021-09-30 DIAGNOSIS — R5383 Other fatigue: Secondary | ICD-10-CM

## 2021-09-30 DIAGNOSIS — R002 Palpitations: Secondary | ICD-10-CM

## 2021-09-30 MED ORDER — CITALOPRAM HYDROBROMIDE 20 MG PO TABS
20.0000 mg | ORAL_TABLET | Freq: Every day | ORAL | 3 refills | Status: DC
  Filled 2021-09-30: qty 30, 30d supply, fill #0

## 2021-09-30 NOTE — Progress Notes (Signed)
Subjective:   By signing my name below, I, Pamela [name], attest that this documentation has been prepared under the direction and in the presence of Pamela Alar, NP 09/30/2021.   Patient ID: Pamela [name], female    DOB: 1971/02/24, 51 y.o.   MRN: [id number]  No chief complaint on file.   HPI Patient is in today for establishment of new patient care  She was previously seeing Dr. Coralyn Mark.  Palpitations- She is currently seeing Dr. Candee Furbish and has an echocardiogram scheduled for 10/03/2021 to manage her palpitations.   Migraines- She complains of recurring migraines she has dealt with all her life. She reports having migraine triggers, such as: if she gets too hot, if she changes her routine, if she lays in bed and if she does not drink enough water. If she wakes up with migraines she cannot get rid of it.  Hot flashes- She complains of hot flashes due to menopause. She has up to 30 hot flashes a day. The hot flashes have worsened. It has been 6 months since her last menstrual cycle. She is requesting a medication to manage her menopause.   Amberen- She has been taking the OTC medication Amberen to manage her symptoms. She takes Amberen at night before she sleeps.  Sleep- She complains that she has been unable to sleep.  She expresses her discomfort with her bed and sleeping. She reports that her sleeping has worsened since starting menopause. She wakes up approximately 6 times nightly.   Factor V- Her Factor V mutation has caused her to have issues with blood clotting.   Von willebrand disease- She reports that she cannot take blood thinners due to her von willebrand disease.  Weight- She reports that she has gained 45 lbs since dealing with her menopause symptoms.  Wt Readings from Last 3 Encounters:  09/30/21 225 lb 9.6 oz (102.3 kg)  09/16/21 224 lb (101.6 kg)  02/19/15 198 lb (89.8 kg)   Anxiety and depression- She complains of anxiety and mood swings. She  is requesting a refill on her citalopram 20 mg to manage her anxiety and depression.  Family history-  She is single and does not have any children. She reports that both her mother and family have sleep apnea. Her mother has thyroid issues and heart disease, causing mitral valve issues. Her father is also diabetic and had pancreatic cancer. Maternal grandmother had lung cancer. Paternal grandfather had dementia and potentially alzheimer's. Her brother is healthy.   Social history: She no longer drinks alcoholic beverages. She uses no tobacco or vape products and does not use drugs. She enjoys gardening.   Immunizations: She is up to date with her COVID-19 immunizations. She is requesting a shingles immunization.   Diet: She reports that she eats a balanced diet.  Exercise- She enjoys walking.  Mammogram: Last completed on 07/04/2015. No mammographic evidence of malignancy. Repeat in 1 year. . Dental: She is up to date with her dental care. She has been given retainers by her orthodontist to manage her jaw pain.   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  Jaw pain- She mentions that she fell on her face in college and as been having jaw pain since. She reports that her jaw has been locking and has been given retainers by her orthodontist.   Past Medical History:  Diagnosis Date   Anxiety    Adjustment disorder with anxious/depressed mood   DVT (deep venous  thrombosis) ([place])    Factor V Leiden ([place])    Family history of anesthesia complication    History of blood clots    Migraine headache    Mitral valve disorders(424.0)    with regurgitation   Mitral valve insufficiency and aortic valve insufficiency    Palpitations    PONV (postoperative nausea and vomiting)    Von Willebrand disease ([place])     Past Surgical History:  Procedure Laterality Date   MANDIBLE FRACTURE SURGERY     PAROTIDECTOMY Right 10/27/2013   superficial   on right           Dr Constance Holster    PAROTIDECTOMY Right 10/27/2013   Procedure: [place];  Surgeon: Izora Gala, MD;  Location: [facility] OR;  Service: ENT;  Laterality: Right;    Family History  Problem Relation Age of Onset   Mitral valve prolapse Mother    Heart disease Mother    Mitral valve prolapse Maternal Grandfather    Stroke Maternal Grandfather    Cancer Maternal Grandmother        lung    Social History   Socioeconomic History   Marital status: Single    Spouse name: Not on file   Number of children: Not on file   Years of education: college   Highest education level: Not on file  Occupational History   Occupation: ACCOUNTANT    Employer: [employer]  Tobacco Use   Smoking status: Never   Smokeless tobacco: Never   Tobacco comments:    never used tobacco  Substance and Sexual Activity   Alcohol use: Yes    Alcohol/week: 2.0 - 3.0 standard drinks of alcohol    Types: 2 - 3 Standard drinks or equivalent per week   Drug use: No   Sexual activity: Yes    Partners: Male    Birth control/protection: None  Other Topics Concern   Not on file  Social History Narrative   Not on file   Social Determinants of Health   Financial Resource Strain: Not on file  Food Insecurity: Not on file  Transportation Needs: Not on file  Physical Activity: Not on file  Stress: Not on file  Social Connections: Not on file  Intimate Partner Violence: Not on file    Outpatient Medications Prior to Visit  Medication Sig Dispense Refill   Naproxen Sodium 220 MG CAPS Take by mouth.     Facility-Administered Medications Prior to Visit  Medication Dose Route Frequency Provider Last Rate Last Admin   TDaP (BOOSTRIX) injection 0.5 mL  0.5 mL Intramuscular Once Schoenhoff, Altamese Cabal, MD        No Known Allergies  Review of Systems  Constitutional:        (+) Hot Flashes  Neurological:  Positive for headaches.  Psychiatric/Behavioral:  The patient is nervous/anxious (+Mood Swings) and has  insomnia.        Objective:    Physical Exam Constitutional:      General: She is not in acute distress.    Appearance: Normal appearance. She is not ill-appearing.  HENT:     Head: Normocephalic and atraumatic.     Right Ear: External ear normal.     Left Ear: External ear normal.  Eyes:     Extraocular Movements: Extraocular movements intact.     Pupils: Pupils are equal, round, and reactive to light.  Neck:     Comments: Thyroid slightly enlarged.  Cardiovascular:     Rate and  Rhythm: Normal rate and regular rhythm.     Pulses: Normal pulses.     Heart sounds: Murmur heard.     Systolic murmur is present with a grade of 1/6.     No gallop.  Pulmonary:     Effort: Pulmonary effort is normal. No respiratory distress.     Breath sounds: Normal breath sounds. No wheezing or rales.  Lymphadenopathy:     Cervical: No cervical adenopathy.  Skin:    General: Skin is warm and dry.  Neurological:     Mental Status: She is alert and oriented to person, place, and time.  Psychiatric:        Mood and Affect: Mood normal.        Behavior: Behavior normal.        Judgment: Judgment normal.     There were no vitals taken for this visit. Wt Readings from Last 3 Encounters:  09/16/21 224 lb (101.6 kg)  02/19/15 198 lb (89.8 kg)  07/30/14 196 lb (88.9 kg)    Diabetic Foot Exam - Simple   No data filed    Lab Results  Component Value Date   WBC 4.9 07/30/2014   HGB 13.3 07/30/2014   HCT 40.1 07/30/2014   PLT 179 07/30/2014   GLUCOSE 91 07/30/2014   CHOL 122 09/10/2011   TRIG 61 09/10/2011   HDL 49 09/10/2011   LDLCALC 61 09/10/2011   ALT 29 07/30/2014   AST 29 07/30/2014   NA 137 07/30/2014   K 4.8 07/30/2014   CL 103 07/30/2014   CREATININE 0.64 07/30/2014   BUN 16 07/30/2014   CO2 26 07/30/2014   TSH 1.050 09/10/2011    Lab Results  Component Value Date   TSH 1.050 09/10/2011   Lab Results  Component Value Date   WBC 4.9 07/30/2014   HGB 13.3  07/30/2014   HCT 40.1 07/30/2014   MCV 85.3 07/30/2014   PLT 179 07/30/2014   Lab Results  Component Value Date   NA 137 07/30/2014   K 4.8 07/30/2014   CO2 26 07/30/2014   GLUCOSE 91 07/30/2014   BUN 16 07/30/2014   CREATININE 0.64 07/30/2014   BILITOT 0.5 07/30/2014   ALKPHOS 56 07/30/2014   AST 29 07/30/2014   ALT 29 07/30/2014   PROT 7.0 07/30/2014   ALBUMIN 4.1 07/30/2014   CALCIUM 9.5 07/30/2014   ANIONGAP 12 10/20/2013   Lab Results  Component Value Date   CHOL 122 09/10/2011   Lab Results  Component Value Date   HDL 49 09/10/2011   Lab Results  Component Value Date   LDLCALC 61 09/10/2011   Lab Results  Component Value Date   TRIG 61 09/10/2011   Lab Results  Component Value Date   CHOLHDL 2.5 09/10/2011   No results found for: "HGBA1C"     Assessment & Plan:   Problem List Items Addressed This Visit   None  No orders of the defined types were placed in this encounter.  I, Pamela [name], personally preformed the services described in this documentation.  All medical record entries made by the scribe were at my direction and in my presence.  I have reviewed the chart and discharge instructions (if applicable) and agree that the record reflects my personal performance and is accurate and complete. 09/30/2021.   I,Mohammed Iqbal,acting as a Education administrator for [employer], NP.,have documented all relevant documentation on the behalf of Nance Pear, NP,as directed by  Wellington Hampshire  Inda Castle, NP while in the presence of Nance Pear, NP.     Mohammed [place]

## 2021-10-01 ENCOUNTER — Encounter: Payer: Self-pay | Admitting: Family

## 2021-10-01 ENCOUNTER — Telehealth: Payer: Self-pay | Admitting: Family

## 2021-10-01 DIAGNOSIS — E010 Iodine-deficiency related diffuse (endemic) goiter: Secondary | ICD-10-CM | POA: Insufficient documentation

## 2021-10-01 DIAGNOSIS — Z820 Family history of epilepsy and other diseases of the nervous system: Secondary | ICD-10-CM | POA: Insufficient documentation

## 2021-10-01 DIAGNOSIS — R232 Flushing: Secondary | ICD-10-CM | POA: Insufficient documentation

## 2021-10-01 DIAGNOSIS — Z1211 Encounter for screening for malignant neoplasm of colon: Secondary | ICD-10-CM | POA: Insufficient documentation

## 2021-10-01 LAB — CBC WITH DIFFERENTIAL/PLATELET
Basophils Absolute: 0 10*3/uL (ref 0.0–0.1)
Basophils Relative: 0.9 % (ref 0.0–3.0)
Eosinophils Absolute: 0.2 10*3/uL (ref 0.0–0.7)
Eosinophils Relative: 3.5 % (ref 0.0–5.0)
HCT: 39 % (ref 36.0–46.0)
Hemoglobin: 13.2 g/dL (ref 12.0–15.0)
Lymphocytes Relative: 23.9 % (ref 12.0–46.0)
Lymphs Abs: 1.2 10*3/uL (ref 0.7–4.0)
MCHC: 33.9 g/dL (ref 30.0–36.0)
MCV: 84.2 fl (ref 78.0–100.0)
Monocytes Absolute: 0.4 10*3/uL (ref 0.1–1.0)
Monocytes Relative: 7.4 % (ref 3.0–12.0)
Neutro Abs: 3.4 10*3/uL (ref 1.4–7.7)
Neutrophils Relative %: 64.3 % (ref 43.0–77.0)
Platelets: 210 10*3/uL (ref 150.0–400.0)
RBC: 4.63 Mil/uL (ref 3.87–5.11)
RDW: 13.2 % (ref 11.5–15.5)
WBC: 5.2 10*3/uL (ref 4.0–10.5)

## 2021-10-01 LAB — HEMOGLOBIN A1C: Hgb A1c MFr Bld: 5.4 % (ref 4.6–6.5)

## 2021-10-01 LAB — COMPREHENSIVE METABOLIC PANEL
ALT: 20 U/L (ref 0–35)
AST: 22 U/L (ref 0–37)
Albumin: 4.6 g/dL (ref 3.5–5.2)
Alkaline Phosphatase: 92 U/L (ref 39–117)
BUN: 19 mg/dL (ref 6–23)
CO2: 29 mEq/L (ref 19–32)
Calcium: 9.6 mg/dL (ref 8.4–10.5)
Chloride: 100 mEq/L (ref 96–112)
Creatinine, Ser: 0.87 mg/dL (ref 0.40–1.20)
GFR: 76.19 mL/min (ref >60.00)
Glucose, Bld: 84 mg/dL (ref 70–99)
Potassium: 4.5 mEq/L (ref 3.5–5.1)
Sodium: 136 mEq/L (ref 135–145)
Total Bilirubin: 0.6 mg/dL (ref 0.2–1.2)
Total Protein: 7.6 g/dL (ref 6.0–8.3)

## 2021-10-01 LAB — TSH: TSH: 2.39 u[IU]/mL (ref 0.35–5.50)

## 2021-10-01 NOTE — Telephone Encounter (Signed)
Opened in error

## 2021-10-01 NOTE — Assessment & Plan Note (Addendum)
Will continue citalopram for now. She is not a good candidate for HRT given her Factor V Leiden mutation.  Consider trial of gabapentin next visit.

## 2021-10-01 NOTE — Assessment & Plan Note (Signed)
  Factor V- Her Factor V mutation has a hx of DVT once.  She was treated with coumadin x 6 months.

## 2021-10-01 NOTE — Assessment & Plan Note (Signed)
She has a strong family hx of sleep apnea, notes poor quality sleep, and AM headaches.  I have a strong suspicion for OSA. Will refer for evaluation/sleep study.

## 2021-10-01 NOTE — Assessment & Plan Note (Signed)
She reports that she cannot take blood thinners due to her von willebrand disease. She used to see Dr. Marin Olp (hematology).  Last visit was back in 2015 prior to her parotid surgery.  It was recommended that she receive DDAVP pre-operatively to decrease her bleeding. States that she cannot take any blood thinners because even aspirin causes her to bleed so badly that a small cut often leads to an urgent care visit as it won't stop bleeding on its own. At her last visit with hematology in 2015, Dr. Marin Olp noted that he only needed to see her back if she developed a clot of if she were to need surgery.

## 2021-10-01 NOTE — Assessment & Plan Note (Signed)
Declines colonoscopy. Agreeable to cologuard.

## 2021-10-01 NOTE — Assessment & Plan Note (Signed)
S/p excision in 2015.  Path noted the following:  - ONCOCYTOMA, 1.3 CM. - MARGINS NOT INVOLVED.

## 2021-10-01 NOTE — Assessment & Plan Note (Signed)
New. Check TSH and [place].

## 2021-10-01 NOTE — Assessment & Plan Note (Signed)
Has echo scheduled with cardiology. Clinically appears compensated.

## 2021-10-03 ENCOUNTER — Ambulatory Visit (HOSPITAL_COMMUNITY): Payer: BC Managed Care – PPO | Attending: Cardiology

## 2021-10-03 DIAGNOSIS — I080 Rheumatic disorders of both mitral and aortic valves: Secondary | ICD-10-CM | POA: Insufficient documentation

## 2021-10-03 DIAGNOSIS — I340 Nonrheumatic mitral (valve) insufficiency: Secondary | ICD-10-CM

## 2021-10-03 DIAGNOSIS — I341 Nonrheumatic mitral (valve) prolapse: Secondary | ICD-10-CM | POA: Diagnosis not present

## 2021-10-03 DIAGNOSIS — I77810 Thoracic aortic ectasia: Secondary | ICD-10-CM

## 2021-10-03 DIAGNOSIS — I517 Cardiomegaly: Secondary | ICD-10-CM | POA: Diagnosis not present

## 2021-10-04 LAB — ECHOCARDIOGRAM COMPLETE
Area-P 1/2: 3.6 cm2
S' Lateral: 3 cm

## 2021-10-05 DIAGNOSIS — Z1211 Encounter for screening for malignant neoplasm of colon: Secondary | ICD-10-CM | POA: Diagnosis not present

## 2021-10-05 LAB — COLOGUARD: Cologuard: NEGATIVE

## 2021-10-08 ENCOUNTER — Encounter (HOSPITAL_BASED_OUTPATIENT_CLINIC_OR_DEPARTMENT_OTHER): Payer: Self-pay

## 2021-10-08 ENCOUNTER — Ambulatory Visit (HOSPITAL_BASED_OUTPATIENT_CLINIC_OR_DEPARTMENT_OTHER)
Admission: RE | Admit: 2021-10-08 | Discharge: 2021-10-08 | Disposition: A | Discharge status: Home / Self Care | Payer: BC Managed Care – PPO | Source: Ambulatory Visit | Attending: Family | Admitting: Family

## 2021-10-08 ENCOUNTER — Telehealth: Payer: Self-pay | Admitting: Family

## 2021-10-08 DIAGNOSIS — Z1231 Encounter for screening mammogram for malignant neoplasm of breast: Secondary | ICD-10-CM | POA: Diagnosis not present

## 2021-10-08 DIAGNOSIS — E010 Iodine-deficiency related diffuse (endemic) goiter: Secondary | ICD-10-CM | POA: Diagnosis not present

## 2021-10-08 DIAGNOSIS — E041 Nontoxic single thyroid nodule: Secondary | ICD-10-CM | POA: Insufficient documentation

## 2021-10-08 NOTE — Telephone Encounter (Signed)
Please advise patient there is a 1.5 cm nodule in the right side of her thyroid.  The radiologist is recommending that we perform a fine needle biopsy. This is done with Interventional Radiology at [facility].  I have pended the referral.  Let us know if she hasn't heard back from them in 1 week about scheduling.

## 2021-10-08 NOTE — Telephone Encounter (Signed)
Patient advised of referral and information about needle biopsy. Order signed.

## 2021-10-11 LAB — COLOGUARD: COLOGUARD: NEGATIVE

## 2021-10-20 ENCOUNTER — Other Ambulatory Visit: Payer: Self-pay | Admitting: *Deleted

## 2021-10-20 DIAGNOSIS — I77810 Thoracic aortic ectasia: Secondary | ICD-10-CM

## 2021-10-20 DIAGNOSIS — I080 Rheumatic disorders of both mitral and aortic valves: Secondary | ICD-10-CM

## 2021-10-21 ENCOUNTER — Other Ambulatory Visit (HOSPITAL_COMMUNITY)
Admission: RE | Admit: 2021-10-21 | Discharge: 2021-10-21 | Disposition: A | Discharge status: Home / Self Care | Payer: BC Managed Care – PPO | Source: Ambulatory Visit | Attending: Student | Admitting: Student

## 2021-10-21 ENCOUNTER — Ambulatory Visit
Admission: RE | Admit: 2021-10-21 | Discharge: 2021-10-21 | Disposition: A | Discharge status: Home / Self Care | Payer: BC Managed Care – PPO | Source: Ambulatory Visit | Attending: Family | Admitting: Family

## 2021-10-21 DIAGNOSIS — E041 Nontoxic single thyroid nodule: Secondary | ICD-10-CM | POA: Diagnosis not present

## 2021-10-23 ENCOUNTER — Telehealth: Payer: Self-pay | Admitting: Family

## 2021-10-23 DIAGNOSIS — D497 Neoplasm of unspecified behavior of endocrine glands and other parts of nervous system: Secondary | ICD-10-CM | POA: Insufficient documentation

## 2021-10-23 LAB — CYTOLOGY - NON PAP

## 2021-10-23 NOTE — Telephone Encounter (Signed)
Contacted pt to review cytology results. Left message on her voicemail requesting a call back on my cell phone.

## 2021-10-24 DIAGNOSIS — E041 Nontoxic single thyroid nodule: Secondary | ICD-10-CM | POA: Diagnosis not present

## 2021-10-24 NOTE — Telephone Encounter (Signed)
Contacted pt and reviewed thyroid cytology results. She is agreeable to referral to surgeon and will let us know if she has not heard back from them in 1 week about scheduling.

## 2021-11-05 ENCOUNTER — Encounter (HOSPITAL_COMMUNITY): Payer: Self-pay

## 2021-11-06 DIAGNOSIS — E041 Nontoxic single thyroid nodule: Secondary | ICD-10-CM | POA: Diagnosis not present

## 2021-11-11 ENCOUNTER — Other Ambulatory Visit (HOSPITAL_COMMUNITY)
Admission: RE | Admit: 2021-11-11 | Discharge: 2021-11-11 | Disposition: A | Discharge status: Home / Self Care | Payer: BC Managed Care – PPO | Source: Ambulatory Visit | Attending: Family | Admitting: Family

## 2021-11-11 ENCOUNTER — Encounter: Payer: Self-pay | Admitting: Family

## 2021-11-11 ENCOUNTER — Ambulatory Visit (INDEPENDENT_AMBULATORY_CARE_PROVIDER_SITE_OTHER): Payer: BC Managed Care – PPO | Admitting: Family

## 2021-11-11 VITALS — BP 119/73 | HR 72 | Temp 98.2°F | Resp 16 | Ht 70.0 in | Wt 222.0 lb

## 2021-11-11 DIAGNOSIS — D497 Neoplasm of unspecified behavior of endocrine glands and other parts of nervous system: Secondary | ICD-10-CM

## 2021-11-11 DIAGNOSIS — Z01419 Encounter for gynecological examination (general) (routine) without abnormal findings: Secondary | ICD-10-CM | POA: Diagnosis not present

## 2021-11-11 DIAGNOSIS — Z0000 Encounter for general adult medical examination without abnormal findings: Secondary | ICD-10-CM | POA: Insufficient documentation

## 2021-11-11 DIAGNOSIS — R232 Flushing: Secondary | ICD-10-CM | POA: Diagnosis not present

## 2021-11-11 DIAGNOSIS — Z13220 Encounter for screening for lipoid disorders: Secondary | ICD-10-CM | POA: Diagnosis not present

## 2021-11-11 DIAGNOSIS — Z114 Encounter for screening for human immunodeficiency virus [HIV]: Secondary | ICD-10-CM

## 2021-11-11 DIAGNOSIS — Z1159 Encounter for screening for other viral diseases: Secondary | ICD-10-CM

## 2021-11-11 MED ORDER — CITALOPRAM HYDROBROMIDE 20 MG PO TABS: 10.0000 mg | ORAL_TABLET | Freq: Every day | ORAL | 3 refills | Status: DC

## 2021-11-11 NOTE — Assessment & Plan Note (Addendum)
Discussed healthy diet, exercise, weight loss.  Pap performed today. Recommended covid booster and flu shot this fall. She will return for Shingrix #2.

## 2021-11-11 NOTE — Assessment & Plan Note (Signed)
Much improved. Continue citalopram 10 mg once daily.

## 2021-11-11 NOTE — Patient Instructions (Signed)
Please schedule a follow up dental visit.

## 2021-11-11 NOTE — Progress Notes (Signed)
Subjective:     Patient ID: Pamela [name], female    DOB: September 20, 1970, 51 y.o.   MRN: [id number]  Chief Complaint  Patient presents with   Annual Exam         HPI  Hot flashes- taking 10 mg of citalopram and it has dramatically improved her hot flashes.   Immunizations: will get shingrix #2 on 11/21/2021 Diet:  needs improvement Wt Readings from Last 3 Encounters:  11/11/21 222 lb (100.7 kg)  09/30/21 225 lb 9.6 oz (102.3 kg)  09/16/21 224 lb (101.6 kg)  Exercise: active outside recently Colonoscopy: cologuard negative Pap Smear: 2016- due Mammogram: 10/08/21 Vision: due- she will schedule Dental:  due  Health Maintenance Due  Topic Date Due   Hepatitis C Screening  Never done   PAP SMEAR-Modifier  05/01/2017   COVID-19 Vaccine (3 - Pfizer series) 10/30/2019   INFLUENZA VACCINE  10/21/2021    Past Medical History:  Diagnosis Date   Anxiety    Adjustment disorder with anxious/depressed mood   DVT (deep venous thrombosis) (HCC)    Factor V Leiden ([place])    Family history of anesthesia complication    Heterozygous factor V Leiden mutation ([place])    History of blood clots    Migraine headache    Mitral valve disorders(424.0)    with regurgitation   Mitral valve insufficiency and aortic valve insufficiency    Palpitations    PONV (postoperative nausea and vomiting)    Squamous cell carcinoma in situ    sees Maurine Cane on [employer]   Von Willebrand disease [facility])     Past Surgical History:  Procedure Laterality Date   MANDIBLE FRACTURE SURGERY     PAROTIDECTOMY Right 10/27/2013   superficial   on right           Dr Constance Holster   PAROTIDECTOMY Right 10/27/2013   Procedure: RIGHT SUPERFICIAL PAROTIDECTOMY;  Surgeon: Izora Gala, MD;  Location: [facility] OR;  Service: ENT;  Laterality: Right;    Family History  Problem Relation Age of Onset   Mitral valve prolapse Mother    Hyperthyroidism Mother    Atrial fibrillation Mother    Pancreatic cancer Father    Diabetes  Mellitus II Father    Dementia Father    Mitral valve prolapse Brother    Cancer Maternal Grandmother        lung   Mitral valve prolapse Maternal Grandfather    Stroke Maternal Grandfather    Alzheimer's disease Paternal Grandfather     Social History   Socioeconomic History   Marital status: Single    Spouse name: Not on file   Number of children: Not on file   Years of education: college   Highest education level: Not on file  Occupational History   Occupation: ACCOUNTANT    Employer: [employer]  Tobacco Use   Smoking status: Never   Smokeless tobacco: Never   Tobacco comments:    never used tobacco  Substance and Sexual Activity   Alcohol use: Not Currently    Alcohol/week: 2.0 - 3.0 standard drinks of alcohol    Types: 2 - 3 Standard drinks or equivalent per week   Drug use: No   Sexual activity: Yes    Partners: Male    Birth control/protection: None  Other Topics Concern   Not on file  Social History Narrative   Dance movement psychotherapist at [employer], runs accounting office, stressful job   Single   No children  Enjoys gardening   2 dogs   Spends time with her mom   Enjoys playing cards   Enjoys walking.    Social Determinants of Health   Financial Resource Strain: Not on file  Food Insecurity: Not on file  Transportation Needs: Not on file  Physical Activity: Not on file  Stress: Not on file  Social Connections: Not on file  Intimate Partner Violence: Not on file    Outpatient Medications Prior to Visit  Medication Sig Dispense Refill   Ascorbic Acid (VITAMIN C) 100 MG tablet Take by mouth.     Cholecalciferol 125 MCG (5000 UT) TABS Take by mouth.     Multiple Vitamin (MULTI-VITAMIN) tablet Take 1 tablet by mouth daily.     Naproxen Sodium 220 MG CAPS Take by mouth.     citalopram (CELEXA) 20 MG tablet Take 1 tablet (20 mg total) by mouth daily. 30 tablet 3   Facility-Administered Medications Prior to Visit  Medication Dose Route Frequency  Provider Last Rate Last Admin   TDaP (BOOSTRIX) injection 0.5 mL  0.5 mL Intramuscular Once Schoenhoff, Altamese Cabal, MD        No Known Allergies  Review of Systems  Constitutional:  Positive for weight loss.  HENT:  Negative for congestion and hearing loss.   Eyes:  Negative for blurred vision.  Respiratory:  Negative for cough.   Cardiovascular:  Negative for leg swelling.  Gastrointestinal:  Negative for constipation and diarrhea.  Genitourinary:  Negative for dysuria and frequency.  Musculoskeletal:  Negative for joint pain and myalgias.  Neurological:  Positive for headaches (no recent migraines).  Psychiatric/Behavioral:         Denies depression symptoms       Objective:    Physical Exam  BP 119/73 (BP Location: Left Arm, Patient Position: Sitting, Cuff Size: Large)   Pulse 72   Temp 98.2 F (36.8 C) (Oral)   Resp 16   Ht '5\' 10"'$  (1.778 m)   Wt 222 lb (100.7 kg)   LMP 02/09/2015   SpO2 97%   BMI 31.85 kg/m  Wt Readings from Last 3 Encounters:  11/11/21 222 lb (100.7 kg)  09/30/21 225 lb 9.6 oz (102.3 kg)  09/16/21 224 lb (101.6 kg)   Physical Exam  Constitutional: She is oriented to person, place, and time. She appears well-developed and well-nourished. No distress.  HENT:  Head: Normocephalic and atraumatic.  Right Ear: Tympanic membrane and ear canal normal.  Left Ear: Tympanic membrane and ear canal normal.  Mouth/Throat: Oropharynx is clear and moist.  Eyes: Pupils are equal, round, and reactive to light. No scleral icterus.  Neck: Normal range of motion. Right thyromegaly present.  Cardiovascular: Normal rate and regular rhythm.   No murmur heard. Pulmonary/Chest: Effort normal and breath sounds normal. No respiratory distress. He has no wheezes. She has no rales. She exhibits no tenderness.  Abdominal: Soft. Bowel sounds are normal. She exhibits no distension and no mass. There is no tenderness. There is no rebound and no guarding.  Musculoskeletal: She  exhibits no edema.  Lymphadenopathy:    She has no cervical adenopathy.  Neurological: She is alert and oriented to person, place, and time. She has normal patellar reflexes. She exhibits normal muscle tone. Coordination normal.  Skin: Skin is warm and dry.  Psychiatric: She has a normal mood and affect. Her behavior is normal. Judgment and thought content normal.  Breasts: Examined lying Right: Without masses, retractions, discharge or axillary adenopathy.  Left:  Without masses, retractions, discharge or axillary adenopathy.  Inguinal/mons: Normal without inguinal adenopathy  External genitalia: Normal  BUS/Urethra/Skene's glands: Normal  Bladder: Normal  Vagina: Normal  Cervix: Normal  Uterus: normal in size, shape and contour. Midline and mobile  Adnexa/parametria:  Rt: Without masses or tenderness.  Lt: Without masses or tenderness.  Anus and perineum: Normal            Assessment & Plan:       Assessment & Plan:   Problem List Items Addressed This Visit       Unprioritized   Preventative health care    Discussed healthy diet, exercise, weight loss.  Pap performed today. Recommended covid booster and flu shot this fall. She will return for Shingrix #2.       Hot flashes    Much improved. Continue citalopram 10 mg once daily.       Follicular neoplasm of thyroid    Saw Dr. Harlow Asa- plan is for 1 year surveillance due to low risk of malignancy.       Other Visit Diagnoses     Encounter for routine gynecological examination with Papanicolaou smear of cervix    -  Primary   Relevant Orders   Cytology - PAP( [place])   Screening for hyperlipidemia       Relevant Orders   Lipid panel   Encounter for screening for HIV       Relevant Orders   HIV antibody (with reflex)   Need for hepatitis C screening test       Relevant Orders   Hepatitis C Antibody       I have changed Cyara E. Cayson "Candy"'s citalopram. I am also having her maintain her  Naproxen Sodium, vitamin C, Cholecalciferol, and Multi-Vitamin. We will continue to administer Tdap.  Meds ordered this encounter  Medications   citalopram (CELEXA) 20 MG tablet    Sig: Take 0.5 tablets (10 mg total) by mouth daily.    Dispense:  30 tablet    Refill:  3    Order Specific Question:   Supervising Provider    Answer:   Penni Homans A [[id number]]

## 2021-11-11 NOTE — Assessment & Plan Note (Signed)
Saw Dr. Harlow Asa- plan is for 1 year surveillance due to low risk of malignancy.

## 2021-11-12 LAB — LIPID PANEL
Cholesterol: 148 mg/dL (ref 0–200)
HDL: 49.7 mg/dL (ref >39.00)
LDL Cholesterol: 60 mg/dL (ref 0–99)
NonHDL: 98.39
Total CHOL/HDL Ratio: 3
Triglycerides: 192 mg/dL — ABNORMAL HIGH (ref 0.0–149.0)
VLDL: 38.4 mg/dL (ref 0.0–40.0)

## 2021-11-12 LAB — HIV ANTIBODY (ROUTINE TESTING W REFLEX): HIV 1&2 Ab, 4th Generation: NONREACTIVE

## 2021-11-12 LAB — HEPATITIS C ANTIBODY: Hepatitis C Ab: NONREACTIVE

## 2021-11-17 LAB — CYTOLOGY - PAP
Comment: NEGATIVE
Diagnosis: NEGATIVE
Diagnosis: REACTIVE
High risk HPV: POSITIVE — AB

## 2021-11-19 ENCOUNTER — Telehealth: Payer: Self-pay | Admitting: Family

## 2021-11-19 DIAGNOSIS — B977 Papillomavirus as the cause of diseases classified elsewhere: Secondary | ICD-10-CM | POA: Insufficient documentation

## 2021-11-19 NOTE — Telephone Encounter (Signed)
Called but no answer, lvm for patient to call back 

## 2021-11-19 NOTE — Telephone Encounter (Signed)
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  I would recommend that we repeat her pap in 1 year instead of 3 years.

## 2021-11-20 NOTE — Telephone Encounter (Signed)
Patient advised of results and that provider recommends repeating in 1 year. She agrees with plan of care.

## 2021-11-21 ENCOUNTER — Ambulatory Visit (INDEPENDENT_AMBULATORY_CARE_PROVIDER_SITE_OTHER): Payer: BC Managed Care – PPO

## 2021-11-21 DIAGNOSIS — Z23 Encounter for immunization: Secondary | ICD-10-CM

## 2021-11-21 NOTE — Progress Notes (Signed)
Pt here today for 2nd Shingrix vaccine per Melissa.  Shingrix 0.58m injected into L deltoid IM. Pt tolerated injection well.

## 2021-12-04 ENCOUNTER — Other Ambulatory Visit: Payer: Self-pay

## 2021-12-04 ENCOUNTER — Other Ambulatory Visit (HOSPITAL_BASED_OUTPATIENT_CLINIC_OR_DEPARTMENT_OTHER): Payer: Self-pay

## 2021-12-04 ENCOUNTER — Other Ambulatory Visit: Payer: Self-pay | Admitting: Family

## 2021-12-04 MED ORDER — CITALOPRAM HYDROBROMIDE 20 MG PO TABS
10.0000 mg | ORAL_TABLET | Freq: Every day | ORAL | 3 refills | Status: DC
  Filled 2021-12-04: qty 15, 30d supply, fill #0
  Filled 2021-12-30: qty 15, 30d supply, fill #1
  Filled 2022-02-05: qty 15, 30d supply, fill #2
  Filled 2022-03-09: qty 15, 30d supply, fill #3

## 2021-12-30 ENCOUNTER — Other Ambulatory Visit (HOSPITAL_BASED_OUTPATIENT_CLINIC_OR_DEPARTMENT_OTHER): Payer: Self-pay

## 2022-02-05 ENCOUNTER — Other Ambulatory Visit (HOSPITAL_BASED_OUTPATIENT_CLINIC_OR_DEPARTMENT_OTHER): Payer: Self-pay

## 2022-04-07 ENCOUNTER — Other Ambulatory Visit: Payer: Self-pay | Admitting: Family

## 2022-04-08 ENCOUNTER — Other Ambulatory Visit (HOSPITAL_BASED_OUTPATIENT_CLINIC_OR_DEPARTMENT_OTHER): Payer: Self-pay

## 2022-04-08 MED ORDER — CITALOPRAM HYDROBROMIDE 20 MG PO TABS
10.0000 mg | ORAL_TABLET | Freq: Every day | ORAL | 3 refills | Status: DC
  Filled 2022-04-08: qty 15, 30d supply, fill #0
  Filled 2022-05-15 (×2): qty 15, 30d supply, fill #1
  Filled 2022-06-11: qty 15, 30d supply, fill #2
  Filled 2022-07-07: qty 15, 30d supply, fill #3

## 2022-04-15 DIAGNOSIS — H16042 Marginal corneal ulcer, left eye: Secondary | ICD-10-CM | POA: Diagnosis not present

## 2022-04-17 DIAGNOSIS — H16042 Marginal corneal ulcer, left eye: Secondary | ICD-10-CM | POA: Diagnosis not present

## 2022-04-24 DIAGNOSIS — H04123 Dry eye syndrome of bilateral lacrimal glands: Secondary | ICD-10-CM | POA: Diagnosis not present

## 2022-05-15 ENCOUNTER — Other Ambulatory Visit (HOSPITAL_BASED_OUTPATIENT_CLINIC_OR_DEPARTMENT_OTHER): Payer: Self-pay

## 2022-05-15 ENCOUNTER — Ambulatory Visit: Payer: BC Managed Care – PPO | Admitting: Family

## 2022-05-15 VITALS — BP 125/85 | HR 62 | Temp 98.3°F | Resp 16 | Ht 70.0 in | Wt 229.0 lb

## 2022-05-15 DIAGNOSIS — R635 Abnormal weight gain: Secondary | ICD-10-CM | POA: Diagnosis not present

## 2022-05-15 DIAGNOSIS — M79601 Pain in right arm: Secondary | ICD-10-CM | POA: Diagnosis not present

## 2022-05-15 DIAGNOSIS — F419 Anxiety disorder, unspecified: Secondary | ICD-10-CM | POA: Diagnosis not present

## 2022-05-15 DIAGNOSIS — R232 Flushing: Secondary | ICD-10-CM

## 2022-05-15 DIAGNOSIS — E041 Nontoxic single thyroid nodule: Secondary | ICD-10-CM

## 2022-05-15 DIAGNOSIS — G43909 Migraine, unspecified, not intractable, without status migrainosus: Secondary | ICD-10-CM

## 2022-05-15 DIAGNOSIS — Z23 Encounter for immunization: Secondary | ICD-10-CM | POA: Diagnosis not present

## 2022-05-15 MED ORDER — MELOXICAM 7.5 MG PO TABS
7.5000 mg | ORAL_TABLET | Freq: Every day | ORAL | 0 refills | Status: DC
  Filled 2022-05-15: qty 14, 14d supply, fill #0

## 2022-05-15 MED ORDER — SUMATRIPTAN SUCCINATE 50 MG PO TABS
ORAL_TABLET | ORAL | 5 refills | Status: AC
  Filled 2022-05-15: qty 10, 30d supply, fill #0
  Filled 2022-06-11: qty 10, 30d supply, fill #1

## 2022-05-15 NOTE — Assessment & Plan Note (Signed)
New. Rx with trial of meloxicam.

## 2022-05-15 NOTE — Progress Notes (Signed)
Subjective:   By signing my name below, I, Marlana Latus, attest that this documentation has been prepared under the direction and in the presence of Nance Pear, NP 05/15/22   Patient ID: Pamela [name], female    DOB: [birth year], 52 y.o.   MRN: [id number]  Chief Complaint  Patient presents with   Thyromegaly    Here for follow up   Arm Pain    Patient complains of on and off right arm pain   Weight Management Screening    Here to discuss weight management.     HPI Patient is in today for a 6 month follow up.   Right arm pain: She complains of arm pain accompanied by a rash. The pain is mainly in her elbow but radiates up and down her arm. She reports that this has happened to her before and the rash comes after the pain. The last time this happened was in 2016 and she had followed up with a dermatologist. She believes it may be stress related. She notes that when she was younger she would experience upper chest pain when she was very stressed.   Weight: She states she has gained about 10 lbs. She is interested in [employer] for about 6 sessions.  Hot flashes: She has been taking her medications and tends to help her. She has been taking Amberen.   Migraines: She has been having sporadic migraines more frequently lately. She sometimes experiences nausea. She believes it may be related to stress from work. She recently took a 5 day weekend in order to rest more, which helped a bit. She occasionally has felt as though she may have to go to the ED. She has a neighbor  she can call to bring her a Coke if she has a severe migraine.   Immunizations: She has not received the flu vaccine but is interested in receiving one today. She has not had the Covid vaccine.   Past Medical History:  Diagnosis Date   Anxiety    Adjustment disorder with anxious/depressed mood   DVT (deep venous thrombosis) (HCC)    Factor V Leiden ([place])    Family history of anesthesia complication     Heterozygous factor V Leiden mutation ([place])    History of blood clots    Migraine headache    Mitral valve disorders(424.0)    with regurgitation   Mitral valve insufficiency and aortic valve insufficiency    Palpitations    PONV (postoperative nausea and vomiting)    Squamous cell carcinoma in situ    sees Maurine Cane on [employer]   Von Willebrand disease [facility])     Past Surgical History:  Procedure Laterality Date   MANDIBLE FRACTURE SURGERY     PAROTIDECTOMY Right 10/27/2013   superficial   on right           Dr Constance Holster   PAROTIDECTOMY Right 10/27/2013   Procedure: RIGHT SUPERFICIAL PAROTIDECTOMY;  Surgeon: Izora Gala, MD;  Location: [facility] OR;  Service: ENT;  Laterality: Right;    Family History  Problem Relation Age of Onset   Mitral valve prolapse Mother    Hyperthyroidism Mother    Atrial fibrillation Mother    Pancreatic cancer Father    Diabetes Mellitus II Father    Dementia Father    Mitral valve prolapse Brother    Cancer Maternal Grandmother        lung   Mitral valve prolapse Maternal Grandfather    Stroke Maternal  Grandfather    Alzheimer's disease Paternal Grandfather     Social History   Socioeconomic History   Marital status: Single    Spouse name: Not on file   Number of children: Not on file   Years of education: college   Highest education level: Not on file  Occupational History   Occupation: ACCOUNTANT    Employer: [employer]  Tobacco Use   Smoking status: Never   Smokeless tobacco: Never   Tobacco comments:    never used tobacco  Substance and Sexual Activity   Alcohol use: Not Currently    Alcohol/week: 2.0 - 3.0 standard drinks of alcohol    Types: 2 - 3 Standard drinks or equivalent per week   Drug use: No   Sexual activity: Yes    Partners: Male    Birth control/protection: None  Other Topics Concern   Not on file  Social History Narrative   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    Social Determinants of Health   Financial Resource Strain: Not on file  Food Insecurity: Not on file  Transportation Needs: Not on file  Physical Activity: Not on file  Stress: Not on file  Social Connections: Not on file  Intimate Partner Violence: Not on file    Outpatient Medications Prior to Visit  Medication Sig Dispense Refill   Ascorbic Acid (VITAMIN C) 100 MG tablet Take by mouth.     Cholecalciferol 125 MCG (5000 UT) TABS Take by mouth.     citalopram (CELEXA) 20 MG tablet Take 0.5 tablets (10 mg total) by mouth daily. 15 tablet 3   Multiple Vitamin (MULTI-VITAMIN) tablet Take 1 tablet by mouth daily.     Naproxen Sodium 220 MG CAPS Take by mouth.     Facility-Administered Medications Prior to Visit  Medication Dose Route Frequency Provider Last Rate Last Admin   TDaP (BOOSTRIX) injection 0.5 mL  0.5 mL Intramuscular Once Schoenhoff, Altamese Cabal, MD        No Known Allergies  Review of Systems  Musculoskeletal:  Positive for myalgias (right arm pain).       Objective:    Physical Exam Constitutional:      General: She is not in acute distress.    Appearance: Normal appearance. She is not ill-appearing.  HENT:     Head: Normocephalic and atraumatic.     Right Ear: External ear normal.     Left Ear: External ear normal.  Eyes:     Extraocular Movements: Extraocular movements intact.     Pupils: Pupils are equal, round, and reactive to light.  Neck:     Thyroid: Thyromegaly (Right greater than left) present.  Cardiovascular:     Rate and Rhythm: Normal rate and regular rhythm.     Heart sounds: Normal heart sounds. No murmur heard.    No gallop.  Pulmonary:     Effort: Pulmonary effort is normal. No respiratory distress.     Breath sounds: Normal breath sounds. No wheezing or rales.  Skin:    General: Skin is warm and dry.  Neurological:      Mental Status: She is alert and oriented to person, place, and time.  Psychiatric:        Judgment: Judgment normal.     BP 125/85 (BP  Location: Left Arm, Patient Position: Sitting, Cuff Size: Large)   Pulse 62   Temp 98.3 F (36.8 C) (Oral)   Resp 16   Ht '5\' 10"'$  (1.778 m)   Wt 229 lb (103.9 kg)   LMP 02/09/2015   SpO2 100%   BMI 32.86 kg/m  Wt Readings from Last 3 Encounters:  05/15/22 229 lb (103.9 kg)  11/11/21 222 lb (100.7 kg)  09/30/21 225 lb 9.6 oz (102.3 kg)       Assessment & Plan:  Thyroid nodule Assessment & Plan: Had FNA of nodule- referred to surgeon in August 23 for a 1.5 cm nodule in the inferior right thyroid lobe. Fine-needle aspiration biopsy showed atypia. Subsequent molecular genetic testing returned with a result of benign, rendering a risk of malignancy of less than 4%. Per surgeon- he wishes to see her back in August for repeat US/TSH and advised against surgical intervention at this time.    Musculoskeletal arm pain, right Assessment & Plan: New. Rx with trial of meloxicam.  Orders: -     Meloxicam; Take 1 tablet (7.5 mg total) by mouth daily.  Dispense: 14 tablet; Refill: 0  Weight gain -     Amb Ref to Medical Weight Management -     TSH  Anxiety Assessment & Plan: Stable/improved on citalopram.    Migraine without status migrainosus, not intractable, unspecified migraine type Assessment & Plan: Notes increased frequency of migraines.  Attributes this to stress.  Will give trial of imitrex.    Hot flashes Assessment & Plan: Improved with citalopram.    Other orders -     SUMAtriptan Succinate; Take 1 tablet by mouth as directed. May repeat in 2 hours if headache persists or recurs.  Dispense: 10 tablet; Refill: 5     I,Rachel Rivera,acting as a scribe for Nance Pear, NP.,have documented all relevant documentation on the behalf of Nance Pear, NP,as directed by  Nance Pear, NP while in the presence of  Nance Pear, NP.   I, Nance Pear, NP, personally preformed the services described in this documentation.  All medical record entries made by the scribe were at my direction and in my presence.  I have reviewed the chart and discharge instructions (if applicable) and agree that the record reflects my personal performance and is accurate and complete. 05/15/22   Nance Pear, NP

## 2022-05-15 NOTE — Assessment & Plan Note (Signed)
Stable/improved on citalopram.

## 2022-05-15 NOTE — Addendum Note (Signed)
Addended by: Jiles Prows on: 05/15/2022 03:55 PM   Modules accepted: Orders

## 2022-05-15 NOTE — Assessment & Plan Note (Signed)
Had FNA of nodule- referred to surgeon in August 23 for a 1.5 cm nodule in the inferior right thyroid lobe. Fine-needle aspiration biopsy showed atypia. Subsequent molecular genetic testing returned with a result of benign, rendering a risk of malignancy of less than 4%. Per surgeon- he wishes to see her back in August for repeat US/TSH and advised against surgical intervention at this time.

## 2022-05-15 NOTE — Assessment & Plan Note (Signed)
Notes increased frequency of migraines.  Attributes this to stress.  Will give trial of imitrex.

## 2022-05-15 NOTE — Assessment & Plan Note (Signed)
Improved with citalopram.

## 2022-05-16 LAB — TSH: TSH: 1.93 mIU/L

## 2022-07-14 ENCOUNTER — Other Ambulatory Visit (HOSPITAL_BASED_OUTPATIENT_CLINIC_OR_DEPARTMENT_OTHER): Payer: Self-pay

## 2022-07-14 ENCOUNTER — Ambulatory Visit: Payer: BC Managed Care – PPO | Admitting: Family

## 2022-07-14 VITALS — BP 138/79 | HR 66 | Temp 98.1°F | Resp 16 | Wt 230.0 lb

## 2022-07-14 DIAGNOSIS — R232 Flushing: Secondary | ICD-10-CM

## 2022-07-14 DIAGNOSIS — F32A Depression, unspecified: Secondary | ICD-10-CM

## 2022-07-14 DIAGNOSIS — M79601 Pain in right arm: Secondary | ICD-10-CM

## 2022-07-14 MED ORDER — CITALOPRAM HYDROBROMIDE 20 MG PO TABS
20.0000 mg | ORAL_TABLET | Freq: Every day | ORAL | 0 refills | Status: DC
Start: 2022-07-14 — End: 2022-11-13
  Filled 2022-07-14 – 2022-07-22 (×3): qty 90, 90d supply, fill #0

## 2022-07-14 MED ORDER — MELOXICAM 7.5 MG PO TABS
7.5000 mg | ORAL_TABLET | Freq: Every day | ORAL | 0 refills | Status: DC | PRN
  Filled 2022-07-14: qty 14, 14d supply, fill #0

## 2022-07-14 NOTE — Progress Notes (Signed)
Subjective:   By signing my name below, I, Pamela [name], attest that this documentation has been prepared under the direction and in the presence of [name], NP.  07/14/2022.   Patient ID: Pamela [name], female    DOB: April 28, 1970, 52 y.o.   MRN: [id number]  Chief Complaint  Patient presents with   Arm Pain    Pain on right arm    HPI Patient is in today for an office visit.  Right arm pain:  At her last visit she was prescribed a trial of meloxicam for her pain. She reports that this worked well until she ran out. She then started taking Aleve. Her arm pain was most severe a week and a half ago with throbbing and pounding pain as if she had a headache. This kept her awake at night. She states this is the third event of similar right arm pain. This time she mostly experienced pain around her right elbow. She believes her arm pain has been related to her increased activity. She has been very busy with working on her house and landscaping outside. At work she has been busy and typing at a computer for days. Recently, she decided to rest and do nothing; she has also been treating her arm with ice. In the last 2 days her arm pain has significantly improved.   Mood:  For a while now, especially this year she has been feeling overwhelmed. Her work schedule has been very busy lately. She denies feeling anxious or worried; instead it is sometimes "just too much" to handle. Occasionally her overwhelming feelings have been severe. She has felt like she was unable to function at all and became tearful at those times. On some days she struggles with motivation, and on others she is able to focus on and complete her activities. At home on the weekends she doesn't want to do anything. She does feel better if she goes on a walk or completes her crafts. She tries to spend as much time as possible with her family over the weekends.  Hot flashes:  She is still taking citalopram for hot flashes.  Every day she has 3+ hot flashes that are severe and may wake her up at night.     Past Medical History:  Diagnosis Date   Anxiety    Adjustment disorder with anxious/depressed mood   DVT (deep venous thrombosis) (HCC)    Factor V Leiden (HCC)    Family history of anesthesia complication    Heterozygous factor V Leiden mutation (HCC)    History of blood clots    Migraine headache    Mitral valve disorders(424.0)    with regurgitation   Mitral valve insufficiency and aortic valve insufficiency    Palpitations    PONV (postoperative nausea and vomiting)    Squamous cell carcinoma in situ    sees Sallee Lange on [employer]   Von Willebrand disease [facility])     Past Surgical History:  Procedure Laterality Date   MANDIBLE FRACTURE SURGERY     PAROTIDECTOMY Right 10/27/2013   superficial   on right           Dr Pollyann Kennedy   PAROTIDECTOMY Right 10/27/2013   Procedure: RIGHT SUPERFICIAL PAROTIDECTOMY;  Surgeon: Serena Colonel, MD;  Location: [facility] OR;  Service: ENT;  Laterality: Right;    Family History  Problem Relation Age of Onset   Mitral valve prolapse Mother    Hyperthyroidism Mother    Atrial fibrillation Mother  Pancreatic cancer Father    Diabetes Mellitus II Father    Dementia Father    Mitral valve prolapse Brother    Cancer Maternal Grandmother        lung   Mitral valve prolapse Maternal Grandfather    Stroke Maternal Grandfather    Alzheimer's disease Paternal Grandfather     Social History   Socioeconomic History   Marital status: Single    Spouse name: Not on file   Number of children: Not on file   Years of education: college   Highest education level: Not on file  Occupational History   Occupation: ACCOUNTANT    Employer: [employer]  Tobacco Use   Smoking status: Never   Smokeless tobacco: Never   Tobacco comments:    never used tobacco  Substance and Sexual Activity   Alcohol use: Not Currently    Alcohol/week: 2.0 - 3.0 standard drinks of alcohol     Types: 2 - 3 Standard drinks or equivalent per week   Drug use: No   Sexual activity: Yes    Partners: Male    Birth control/protection: None  Other Topics Concern   Not on file  Social History Narrative   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    Social Determinants of Health   Financial Resource Strain: Not on file  Food Insecurity: Not on file  Transportation Needs: Not on file  Physical Activity: Not on file  Stress: Not on file  Social Connections: Not on file  Intimate Partner Violence: Not on file    Outpatient Medications Prior to Visit  Medication Sig Dispense Refill   Ascorbic Acid (VITAMIN C) 100 MG tablet Take by mouth.     Cholecalciferol 125 MCG (5000 UT) TABS Take by mouth.     Multiple Vitamin (MULTI-VITAMIN) tablet Take 1 tablet by mouth daily.     SUMAtriptan (IMITREX) 50 MG tablet Take 1 tablet by mouth as directed. May repeat in 2 hours if headache persists or recurs. 10 tablet 5   citalopram (CELEXA) 20 MG tablet Take 0.5 tablets (10 mg total) by mouth daily. 15 tablet 3   meloxicam (MOBIC) 7.5 MG tablet Take 1 tablet (7.5 mg total) by mouth daily. 14 tablet 0   Facility-Administered Medications Prior to Visit  Medication Dose Route Frequency Provider Last Rate Last Admin   TDaP (BOOSTRIX) injection 0.5 mL  0.5 mL Intramuscular Once Schoenhoff, Harrington Challenger, MD        No Known Allergies  Review of Systems  Constitutional:        +Hot flashes  Psychiatric/Behavioral:         +Feeling overwhelmed    See HPI.     Objective:    Physical Exam Constitutional:      Appearance: Normal appearance.  HENT:     Head: Normocephalic and atraumatic.     Right Ear: Tympanic membrane, ear canal and external ear normal.     Left Ear: Tympanic membrane, ear canal and external ear normal.  Eyes:      Extraocular Movements: Extraocular movements intact.     Pupils: Pupils are equal, round, and reactive to light.  Cardiovascular:     Rate and Rhythm: Normal rate and regular rhythm.     Heart sounds: Normal heart sounds. No  murmur heard.    No gallop.  Pulmonary:     Effort: Pulmonary effort is normal. No respiratory distress.     Breath sounds: Normal breath sounds. No wheezing or rales.  Skin:    General: Skin is warm and dry.  Neurological:     General: No focal deficit present.     Mental Status: She is alert and oriented to person, place, and time.  Psychiatric:        Mood and Affect: Mood normal.        Behavior: Behavior normal.     BP 138/79 (BP Location: Right Arm, Patient Position: Sitting, Cuff Size: Large)   Pulse 66   Temp 98.1 F (36.7 C) (Oral)   Resp 16   Wt 230 lb (104.3 kg)   LMP 02/09/2015   SpO2 99%   BMI 33.00 kg/m  Wt Readings from Last 3 Encounters:  07/14/22 230 lb (104.3 kg)  05/15/22 229 lb (103.9 kg)  11/11/21 222 lb (100.7 kg)    Diabetic Foot Exam - Simple   No data filed    Lab Results  Component Value Date   WBC 5.2 09/30/2021   HGB 13.2 09/30/2021   HCT 39.0 09/30/2021   PLT 210.0 09/30/2021   GLUCOSE 84 09/30/2021   CHOL 148 11/11/2021   TRIG 192.0 (H) 11/11/2021   HDL 49.70 11/11/2021   LDLCALC 60 11/11/2021   ALT 20 09/30/2021   AST 22 09/30/2021   NA 136 09/30/2021   K 4.5 09/30/2021   CL 100 09/30/2021   CREATININE 0.87 09/30/2021   BUN 19 09/30/2021   CO2 29 09/30/2021   TSH 1.93 05/15/2022   HGBA1C 5.4 09/30/2021    Lab Results  Component Value Date   TSH 1.93 05/15/2022   Lab Results  Component Value Date   WBC 5.2 09/30/2021   HGB 13.2 09/30/2021   HCT 39.0 09/30/2021   MCV 84.2 09/30/2021   PLT 210.0 09/30/2021   Lab Results  Component Value Date   NA 136 09/30/2021   K 4.5 09/30/2021   CO2 29 09/30/2021   GLUCOSE 84 09/30/2021   BUN 19 09/30/2021   CREATININE 0.87 09/30/2021   BILITOT 0.6  09/30/2021   ALKPHOS 92 09/30/2021   AST 22 09/30/2021   ALT 20 09/30/2021   PROT 7.6 09/30/2021   ALBUMIN 4.6 09/30/2021   CALCIUM 9.6 09/30/2021   ANIONGAP 12 10/20/2013   GFR 76.19 09/30/2021   Lab Results  Component Value Date   CHOL 148 11/11/2021   Lab Results  Component Value Date   HDL 49.70 11/11/2021   Lab Results  Component Value Date   LDLCALC 60 11/11/2021   Lab Results  Component Value Date   TRIG 192.0 (H) 11/11/2021   Lab Results  Component Value Date   CHOLHDL 3 11/11/2021   Lab Results  Component Value Date   HGBA1C 5.4 09/30/2021       Assessment & Plan:   Problem List Items Addressed This Visit       Other   Musculoskeletal arm pain, right   Relevant Medications   meloxicam (MOBIC) 7.5 MG tablet     Meds ordered this encounter  Medications   meloxicam (MOBIC) 7.5 MG tablet    Sig: Take 1 tablet (7.5 mg total) by mouth daily as needed for pain.    Dispense:  14 tablet    Refill:  0    Order Specific Question:   Supervising Provider  Answer:   Danise Edge A [[id number]]   citalopram (CELEXA) 20 MG tablet    Sig: Take 1 tablet (20 mg total) by mouth daily.    Dispense:  90 tablet    Refill:  0    Order Specific Question:   Supervising Provider    Answer:   Urbano Heir, personally preformed the services described in this documentation.  All medical record entries made by the scribe were at my direction and in my presence.  I have reviewed the chart and discharge instructions (if applicable) and agree that the record reflects my personal performance and is accurate and complete. 07/14/2022.  I,Mathew Stumpf,acting as a Neurosurgeon for [employer], NP.,have documented all relevant documentation on the behalf of Lemont Fillers, NP,as directed by  Lemont Fillers, NP while in the presence of Lemont Fillers, NP.   Pamela [name]

## 2022-07-15 DIAGNOSIS — F32A Depression, unspecified: Secondary | ICD-10-CM | POA: Insufficient documentation

## 2022-07-15 NOTE — Assessment & Plan Note (Signed)
New. I recommended that we increase her citalopram from  to  once daily.

## 2022-07-15 NOTE — Assessment & Plan Note (Signed)
Uncontrolled. Hopefully the increase in citalopram will also help her hot flashes.

## 2022-07-15 NOTE — Assessment & Plan Note (Signed)
New. Suspect some mild olecranon bursitis and cervical radiculopathy. Trial of meloxicam.

## 2022-07-22 ENCOUNTER — Other Ambulatory Visit (HOSPITAL_BASED_OUTPATIENT_CLINIC_OR_DEPARTMENT_OTHER): Payer: Self-pay

## 2022-08-25 ENCOUNTER — Other Ambulatory Visit (HOSPITAL_BASED_OUTPATIENT_CLINIC_OR_DEPARTMENT_OTHER): Payer: Self-pay

## 2022-08-25 ENCOUNTER — Ambulatory Visit: Payer: BC Managed Care – PPO | Admitting: Family

## 2022-08-25 VITALS — BP 123/77 | HR 69 | Temp 98.1°F | Resp 16 | Wt 228.0 lb

## 2022-08-25 DIAGNOSIS — D497 Neoplasm of unspecified behavior of endocrine glands and other parts of nervous system: Secondary | ICD-10-CM

## 2022-08-25 DIAGNOSIS — M79601 Pain in right arm: Secondary | ICD-10-CM

## 2022-08-25 DIAGNOSIS — R5383 Other fatigue: Secondary | ICD-10-CM | POA: Diagnosis not present

## 2022-08-25 DIAGNOSIS — R232 Flushing: Secondary | ICD-10-CM

## 2022-08-25 MED ORDER — MELOXICAM 7.5 MG PO TABS
7.5000 mg | ORAL_TABLET | Freq: Every day | ORAL | 0 refills | Status: DC | PRN
  Filled 2022-08-25: qty 14, 14d supply, fill #0

## 2022-08-25 NOTE — Assessment & Plan Note (Signed)
She continues to follow with Dr. Gerrit Friends who will be repeating her thyroid US for 1 year follow up later this summer.

## 2022-08-25 NOTE — Patient Instructions (Addendum)
During your visit, we discussed your ongoing right elbow pain, feelings of fatigue, menopausal symptoms, and your thyroid nodule. We also talked about your sleep disturbances and your plans to take time off work to focus on your health.  YOUR PLAN:  -MENOPAUSAL SYMPTOMS: Your hot flashes and mood have improved with the use of Citalopram and an over-the-counter estrogen supplement, Ambrien. Please continue with this regimen.  -FATIGUE: You've reported feeling tired and needing naps on your days off. I encourage you to maintain a regular sleep schedule and practice good sleep hygiene, which means creating a comfortable sleep environment and establishing a relaxing pre-sleep routine.  -RIGHT ELBOW PAIN: Your chronic elbow pain is currently manageable with over-the-counter Diclofenac gel. I will refill your prescription for Meloxicam to have on hand for flare-ups. If your pain worsens, we may consider referring you to physical therapy.  -THYROID NODULE: this is being managed by Dr. Gerrit Friends  INSTRUCTIONS:  Please schedule a physical exam for early September 2024. This is part of your general health maintenance and will help [place] keep track of your overall health.

## 2022-08-25 NOTE — Assessment & Plan Note (Signed)
Menopausal Symptoms: Improved hot flashes and mood with Citalopram 20mg  daily and over-the-counter estrogen supplement (Ambrien). -Continue current regimen.

## 2022-08-25 NOTE — Progress Notes (Addendum)
Subjective:     Patient ID: Pamela [name], female    DOB: Apr 12, 1970, 52 y.o.   MRN: [id number]  Chief Complaint  Patient presents with   Arm Pain    Complains of still having arm pain   Depression    Here for follow up, "doing a little better with the medication increase"    HPI  Discussed the use of AI scribe software for clinical note transcription with the patient, who gave verbal consent to proceed.  History of Present Illness   The patient, with a history of thyroid issues and a family history of arthritis, presents with multiple concerns. The primary concern is persistent right elbow pain, which has been ongoing since 2015. The pain has been managed with over-the-counter NSAIDs and a topical diclofenac gel, which the patient reports has been helpful. The patient also mentions experiencing radiating pain down the right arm and into the shoulder, which she attributes to extensive computer use at work.  The patient also reports feeling overwhelmed and fatigued, which she attributes to work stress and weight concerns. Despite feeling overwhelmed, the patient reports that her mood has improved since increasing her dose of citalopram from 10mg  to 20mg . She also reports a reduction in hot flashes, which she manages with an over-the-counter menopause supplement, Amberen.  The patient also reports sleep disturbances, often waking up around midnight and again at 4 or 5 in the morning. On her days off, she often takes a nap, which she believes may be contributing to her sleep disturbances. Despite these issues, the patient is considering taking time off work to focus on her health and well-being.          Health Maintenance Due  Topic Date Due   COVID-19 Vaccine (3 - 2023-24 season) 11/21/2021    Past Medical History:  Diagnosis Date   Anxiety    Adjustment disorder with anxious/depressed mood   DVT (deep venous thrombosis) (HCC)    Factor V Leiden (HCC)    Family history of  anesthesia complication    Heterozygous factor V Leiden mutation (HCC)    History of blood clots    Migraine headache    Mitral valve disorders(424.0)    with regurgitation   Mitral valve insufficiency and aortic valve insufficiency    Palpitations    PONV (postoperative nausea and vomiting)    Squamous cell carcinoma in situ    sees Sallee Lange on [employer]   Von Willebrand disease [facility])     Past Surgical History:  Procedure Laterality Date   MANDIBLE FRACTURE SURGERY     PAROTIDECTOMY Right 10/27/2013   superficial   on right           Dr Pollyann Kennedy   PAROTIDECTOMY Right 10/27/2013   Procedure: RIGHT SUPERFICIAL PAROTIDECTOMY;  Surgeon: Serena Colonel, MD;  [facility] OR;  Service: ENT;  Laterality: Right;    Family History  Problem Relation Age of Onset   Mitral valve prolapse Mother    Hyperthyroidism Mother    Atrial fibrillation Mother    Pancreatic cancer Father    Diabetes Mellitus II Father    Dementia Father    Mitral valve prolapse Brother    Cancer Maternal Grandmother        lung   Mitral valve prolapse Maternal Grandfather    Stroke Maternal Grandfather    Alzheimer's disease Paternal Grandfather     Social History   Socioeconomic History   Marital status: Single    Spouse  name: Not on file   Number of children: Not on file   Years of education: college   Highest education level: Bachelor's degree (e.g., BA, AB, BS)  Occupational History   Occupation: Agricultural engineer: [employer]  Tobacco Use   Smoking status: Never   Smokeless tobacco: Never   Tobacco comments:    never used tobacco  Substance and Sexual Activity   Alcohol use: Not Currently    Alcohol/week: 2.0 - 3.0 standard drinks of alcohol    Types: 2 - 3 Standard drinks or equivalent per week   Drug use: No   Sexual activity: Yes    Partners: Male    Birth control/protection: None  Other Topics Concern   Not on file  Social History Narrative   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    Social Determinants of Health   Financial Resource Strain: Low Risk  (08/24/2022)   Overall Financial Resource Strain (CARDIA)    Difficulty of Paying Living Expenses: Not hard at all  Food Insecurity: No Food Insecurity (08/24/2022)   Hunger Vital Sign    Worried About Running Out of Food in the Last Year: Never true    Ran Out of Food in the Last Year: Never true  Transportation Needs: No Transportation Needs (08/24/2022)   PRAPARE - Administrator, Civil Service (Medical): No    Lack of Transportation (Non-Medical): No  Physical Activity: Sufficiently Active (08/24/2022)   Exercise Vital Sign    Days of Exercise per Week: 2 days    Minutes of Exercise per Session: 90 min  Stress: No Stress Concern Present (08/24/2022)   [employer] of Occupational Health - Occupational Stress Questionnaire    Feeling of Stress : Not at all  Social Connections: Moderately Isolated (08/24/2022)   Social Connection and Isolation Panel [NHANES]    Frequency of Communication with Friends and Family: More than three times a week    Frequency of Social Gatherings with Friends and Family: Once a week    Attends Religious Services: More than 4 times per year    Active Member of [employer] or Organizations: No    Attends Engineer, structural: Not on file    Marital Status: Divorced  Intimate Partner Violence: Not on file    Outpatient Medications Prior to Visit  Medication Sig Dispense Refill   Ascorbic Acid (VITAMIN C) 100 MG tablet Take by mouth.     Cholecalciferol 125 MCG (5000 UT) TABS Take by mouth.     citalopram (CELEXA) 20 MG tablet Take 1 tablet (20 mg total) by mouth daily. 90 tablet 0   Multiple Vitamin (MULTI-VITAMIN) tablet Take 1 tablet by mouth daily.     SUMAtriptan (IMITREX) 50 MG tablet Take 1  tablet by mouth as directed. May repeat in 2 hours if headache persists or recurs. 10 tablet 5   meloxicam (MOBIC) 7.5 MG tablet Take 1 tablet (7.5 mg total) by mouth daily as needed for pain. 14 tablet 0   Facility-Administered Medications Prior to Visit  Medication Dose Route Frequency Provider Last Rate Last Admin   TDaP (BOOSTRIX) injection 0.5 mL  0.5 mL Intramuscular Once Schoenhoff, Harrington Challenger, MD        No Known Allergies  ROS See HPI  Objective:    Physical Exam Constitutional:      General: She is not in acute distress.    Appearance: Normal appearance. She is well-developed.  HENT:     Head: Normocephalic and atraumatic.     Right Ear: External ear normal.     Left Ear: External ear normal.  Eyes:     General: No scleral icterus. Neck:     Thyroid: No thyromegaly.     Comments: Right sided thyroid fullness Cardiovascular:     Rate and Rhythm: Normal rate and regular rhythm.     Heart sounds: Normal heart sounds. No murmur heard. Pulmonary:     Effort: Pulmonary effort is normal. No respiratory distress.     Breath sounds: Normal breath sounds. No wheezing.  Musculoskeletal:     Cervical back: Neck supple.     Comments: Bilateral UE strength is 5/5 No right elbow swelling or tenderness to palpation  Skin:    General: Skin is warm and dry.  Neurological:     Mental Status: She is alert and oriented to person, place, and time.  Psychiatric:        Mood and Affect: Mood normal.        Behavior: Behavior normal.        Thought Content: Thought content normal.        Judgment: Judgment normal.      BP 123/77 (BP Location: Left Arm, Patient Position: Sitting, Cuff Size: Large)   Pulse 69   Temp 98.1 F (36.7 C) (Oral)   Resp 16   Wt 228 lb (103.4 kg)   LMP 02/09/2015   SpO2 99%   BMI 32.71 kg/m  Wt Readings from Last 3 Encounters:  08/25/22 228 lb (103.4 kg)  07/14/22 230 lb (104.3 kg)  05/15/22 229 lb (103.9 kg)       Assessment & Plan:    Problem List Items Addressed This Visit       Unprioritized   Musculoskeletal arm pain, right    Chronic pain, currently at a manageable level with use of over-the-counter Diclofenac gel. -Refill prescription for Meloxicam to have on hand for flare-ups. -Consider referral to physical therapy if pain worsens.      Relevant Medications   meloxicam (MOBIC) 7.5 MG tablet   Hot flashes - Primary    Menopausal Symptoms: Improved hot flashes and mood with Citalopram 20mg  daily and over-the-counter estrogen supplement (Ambrien). -Continue current regimen.      Follicular neoplasm of thyroid    She continues to follow with Dr. Gerrit Friends who will be repeating her thyroid US for 1 year follow up later this summer.      Fatigue    Fatigue: Reports feeling tired and needing naps on days off. Sleep pattern irregular with frequent awakenings. -Encourage regular sleep schedule and good sleep hygiene.       I am having Wilbert E. Olden "Candy" maintain her vitamin C, Cholecalciferol, Multi-Vitamin, SUMAtriptan, citalopram, and meloxicam. We will continue to administer Tdap.  Meds ordered this encounter  Medications   meloxicam (MOBIC) 7.5 MG tablet    Sig: Take 1 tablet (7.5 mg total) by mouth daily as needed for pain.    Dispense:  14 tablet    Refill:  0    Order Specific Question:   Supervising Provider    Answer:   Danise Edge A [[id number]]

## 2022-08-25 NOTE — Assessment & Plan Note (Signed)
Fatigue: Reports feeling tired and needing naps on days off. Sleep pattern irregular with frequent awakenings. -Encourage regular sleep schedule and good sleep hygiene.

## 2022-08-25 NOTE — Assessment & Plan Note (Signed)
Chronic pain, currently at a manageable level with use of over-the-counter Diclofenac gel. -Refill prescription for Meloxicam to have on hand for flare-ups. -Consider referral to physical therapy if pain worsens.

## 2022-10-06 ENCOUNTER — Ambulatory Visit (HOSPITAL_COMMUNITY): Payer: BC Managed Care – PPO | Attending: Cardiology

## 2022-10-06 DIAGNOSIS — I080 Rheumatic disorders of both mitral and aortic valves: Secondary | ICD-10-CM | POA: Diagnosis not present

## 2022-10-06 DIAGNOSIS — I341 Nonrheumatic mitral (valve) prolapse: Secondary | ICD-10-CM

## 2022-10-06 DIAGNOSIS — I77810 Thoracic aortic ectasia: Secondary | ICD-10-CM | POA: Diagnosis not present

## 2022-10-06 LAB — ECHOCARDIOGRAM COMPLETE
Area-P 1/2: 2.93 cm2
MV M vel: 5.17 m/s
MV Peak grad: 106.9 mmHg
S' Lateral: 2.8 cm

## 2022-10-07 ENCOUNTER — Encounter (INDEPENDENT_AMBULATORY_CARE_PROVIDER_SITE_OTHER): Payer: Self-pay | Admitting: Internal Medicine

## 2022-10-07 DIAGNOSIS — Z0289 Encounter for other administrative examinations: Secondary | ICD-10-CM

## 2022-10-21 ENCOUNTER — Encounter (INDEPENDENT_AMBULATORY_CARE_PROVIDER_SITE_OTHER): Payer: Self-pay

## 2022-11-04 ENCOUNTER — Other Ambulatory Visit: Payer: Self-pay | Admitting: Surgery

## 2022-11-04 DIAGNOSIS — E041 Nontoxic single thyroid nodule: Secondary | ICD-10-CM

## 2022-11-10 ENCOUNTER — Ambulatory Visit (INDEPENDENT_AMBULATORY_CARE_PROVIDER_SITE_OTHER): Payer: Self-pay | Admitting: Internal Medicine

## 2022-11-13 ENCOUNTER — Other Ambulatory Visit (HOSPITAL_COMMUNITY)
Admission: RE | Admit: 2022-11-13 | Discharge: 2022-11-13 | Disposition: A | Discharge status: Home / Self Care | Payer: BC Managed Care – PPO | Source: Ambulatory Visit | Attending: Family | Admitting: Family

## 2022-11-13 ENCOUNTER — Telehealth (HOSPITAL_BASED_OUTPATIENT_CLINIC_OR_DEPARTMENT_OTHER): Payer: Self-pay

## 2022-11-13 ENCOUNTER — Other Ambulatory Visit (HOSPITAL_BASED_OUTPATIENT_CLINIC_OR_DEPARTMENT_OTHER): Payer: Self-pay

## 2022-11-13 ENCOUNTER — Encounter: Payer: Self-pay | Admitting: Family

## 2022-11-13 ENCOUNTER — Other Ambulatory Visit: Payer: Self-pay

## 2022-11-13 ENCOUNTER — Ambulatory Visit (INDEPENDENT_AMBULATORY_CARE_PROVIDER_SITE_OTHER): Payer: BC Managed Care – PPO | Admitting: Family

## 2022-11-13 VITALS — BP 123/68 | HR 89 | Resp 16 | Wt 225.0 lb

## 2022-11-13 DIAGNOSIS — M1990 Unspecified osteoarthritis, unspecified site: Secondary | ICD-10-CM | POA: Insufficient documentation

## 2022-11-13 DIAGNOSIS — F32A Depression, unspecified: Secondary | ICD-10-CM

## 2022-11-13 DIAGNOSIS — Z0001 Encounter for general adult medical examination with abnormal findings: Secondary | ICD-10-CM

## 2022-11-13 DIAGNOSIS — E781 Pure hyperglyceridemia: Secondary | ICD-10-CM

## 2022-11-13 DIAGNOSIS — Z0000 Encounter for general adult medical examination without abnormal findings: Secondary | ICD-10-CM

## 2022-11-13 DIAGNOSIS — G43909 Migraine, unspecified, not intractable, without status migrainosus: Secondary | ICD-10-CM

## 2022-11-13 DIAGNOSIS — Z1231 Encounter for screening mammogram for malignant neoplasm of breast: Secondary | ICD-10-CM

## 2022-11-13 DIAGNOSIS — E041 Nontoxic single thyroid nodule: Secondary | ICD-10-CM | POA: Diagnosis not present

## 2022-11-13 DIAGNOSIS — R232 Flushing: Secondary | ICD-10-CM

## 2022-11-13 DIAGNOSIS — Z01419 Encounter for gynecological examination (general) (routine) without abnormal findings: Secondary | ICD-10-CM | POA: Insufficient documentation

## 2022-11-13 DIAGNOSIS — M79601 Pain in right arm: Secondary | ICD-10-CM | POA: Diagnosis not present

## 2022-11-13 DIAGNOSIS — E669 Obesity, unspecified: Secondary | ICD-10-CM | POA: Insufficient documentation

## 2022-11-13 LAB — T3, FREE: T3, Free: 4.6 pg/mL — ABNORMAL HIGH (ref 2.3–4.2)

## 2022-11-13 LAB — LIPID PANEL
Cholesterol: 170 mg/dL (ref 0–200)
HDL: 45.5 mg/dL (ref >39.00)
LDL Cholesterol: 94 mg/dL (ref 0–99)
NonHDL: 124.23
Total CHOL/HDL Ratio: 4
Triglycerides: 151 mg/dL — ABNORMAL HIGH (ref 0.0–149.0)
VLDL: 30.2 mg/dL (ref 0.0–40.0)

## 2022-11-13 LAB — T4, FREE: Free T4: 0.75 ng/dL (ref 0.60–1.60)

## 2022-11-13 LAB — TSH: TSH: 1.9 u[IU]/mL (ref 0.35–5.50)

## 2022-11-13 MED ORDER — CITALOPRAM HYDROBROMIDE 10 MG PO TABS
10.0000 mg | ORAL_TABLET | Freq: Every day | ORAL | 1 refills | Status: DC
  Filled 2022-11-13 – 2022-12-28 (×2): qty 90, 90d supply, fill #0
  Filled 2023-05-03: qty 90, 90d supply, fill #1

## 2022-11-13 MED ORDER — MELOXICAM 7.5 MG PO TABS
7.5000 mg | ORAL_TABLET | Freq: Every day | ORAL | 1 refills | Status: DC | PRN
  Filled 2022-11-13: qty 30, 30d supply, fill #0
  Filled 2022-12-28: qty 30, 30d supply, fill #1

## 2022-11-13 NOTE — Assessment & Plan Note (Signed)
No longer experiencing multiple hot flashes daily. -No further intervention needed at this time.

## 2022-11-13 NOTE — Assessment & Plan Note (Signed)
Frustration with lack of weight loss despite dietary changes and exercise. Reports discontinuing Citalopram in an attempt to facilitate weight loss, but that did not help. -Encouraged to continue with dietary changes and exercise. -Referral to weight loss clinic- already scheduled.

## 2022-11-13 NOTE — Assessment & Plan Note (Signed)
  Reports experiencing migraines at least once a week. Has Imitrex on hand but has not used it yet. -Continue to use Imitrex as needed for migraines. -Consider daily preventive medication if migraines become more frequent or more severe.

## 2022-11-13 NOTE — Assessment & Plan Note (Signed)
  Reports occasional pain in arm and leg. Previously used Meloxicam with good effect. -Provide Meloxicam to use as needed for flare-ups.

## 2022-11-13 NOTE — Progress Notes (Signed)
Subjective:     Patient ID: Pamela [name], female    DOB: 10/07/1970, 51 y.o.   MRN: [id number]  Chief Complaint  Patient presents with   Depression    Here for follow up, patient reports she discontinued all medications   Obesity    Will like to discuss weight management    Depression        Associated symptoms include headaches (weekly).   Discussed the use of AI scribe software for clinical note transcription with the patient, who gave verbal consent to proceed.  History of Present Illness    The patient, with a history of depression, hot flashes, Factor V Leiden homozygote, acquired von Willebrand disease, and a corneal ulcer, presents with ongoing struggles with depression and weight loss. She is also here for her annual physical today. She reports abruptly discontinuing citalopram three weeks ago, which she believes has exacerbated her emotional distress. She describes feeling angry, crying frequently, and experiencing work-related stress. Despite efforts to lose weight, including dietary changes and exercise, she reports no significant weight loss.  She has a history of migraines, occurring approximately once a week, which she manages without medication.      Immunizations: up to date Diet: working hard to eat healthy Exercise: regular biking Colonoscopy: cologard 2023 negative Pap Smear: HPV high risk positive lat year Mammogram: due Vision: will schedule Dental: up to date     Health Maintenance Due  Topic Date Due   COVID-19 Vaccine (3 - 2023-24 season) 11/21/2021   MAMMOGRAM  10/09/2022   INFLUENZA VACCINE  10/22/2022    Past Medical History:  Diagnosis Date   Anxiety    Adjustment disorder with anxious/depressed mood   DVT (deep venous thrombosis) (HCC)    Factor V Leiden (HCC)    Family history of anesthesia complication    Heterozygous factor V Leiden mutation (HCC)    History of blood clots    Migraine headache    Mitral valve disorders(424.0)     with regurgitation   Mitral valve insufficiency and aortic valve insufficiency    Palpitations    PONV (postoperative nausea and vomiting)    Squamous cell carcinoma in situ    sees Sallee Lange on [employer]   Von Willebrand disease [facility])     Past Surgical History:  Procedure Laterality Date   MANDIBLE FRACTURE SURGERY     PAROTIDECTOMY Right 10/27/2013   superficial   on right           Dr Pollyann Kennedy   PAROTIDECTOMY Right 10/27/2013   Procedure: RIGHT SUPERFICIAL PAROTIDECTOMY;  Surgeon: Serena Colonel, MD;  Location: [facility] OR;  Service: ENT;  Laterality: Right;    Family History  Problem Relation Age of Onset   Mitral valve prolapse Mother    Hyperthyroidism Mother    Atrial fibrillation Mother    Pancreatic cancer Father    Diabetes Mellitus II Father    Dementia Father    Mitral valve prolapse Brother    Cancer Maternal Grandmother        lung   Mitral valve prolapse Maternal Grandfather    Stroke Maternal Grandfather    Alzheimer's disease Paternal Grandfather     Social History   Socioeconomic History   Marital status: Single    Spouse name: Not on file   Number of children: Not on file   Years of education: college   Highest education level: Bachelor's degree (e.g., BA, AB, BS)  Occupational History   Occupation: Airline pilot  Employer: [employer]  Tobacco Use   Smoking status: Never   Smokeless tobacco: Never   Tobacco comments:    never used tobacco  Substance and Sexual Activity   Alcohol use: Not Currently    Alcohol/week: 2.0 - 3.0 standard drinks of alcohol    Types: 2 - 3 Standard drinks or equivalent per week   Drug use: No   Sexual activity: Not Currently    Partners: Male    Birth control/protection: None  Other Topics Concern   Not on file  Social History Narrative   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    Social Determinants of Health   Financial Resource Strain: Low Risk  (08/24/2022)   Overall Financial Resource Strain (CARDIA)    Difficulty of Paying Living Expenses: Not hard at all  Food Insecurity: No Food Insecurity (08/24/2022)   Hunger Vital Sign    Worried About Running Out of Food in the Last Year: Never true    Ran Out of Food in the Last Year: Never true  Transportation Needs: No Transportation Needs (08/24/2022)   PRAPARE - Administrator, Civil Service (Medical): No    Lack of Transportation (Non-Medical): No  Physical Activity: Sufficiently Active (08/24/2022)   Exercise Vital Sign    Days of Exercise per Week: 2 days    Minutes of Exercise per Session: 90 min  Stress: No Stress Concern Present (08/24/2022)   [employer] of Occupational Health - Occupational Stress Questionnaire    Feeling of Stress : Not at all  Social Connections: Moderately Isolated (08/24/2022)   Social Connection and Isolation Panel [NHANES]    Frequency of Communication with Friends and Family: More than three times a week    Frequency of Social Gatherings with Friends and Family: Once a week    Attends Religious Services: More than 4 times per year    Active Member of [employer] or Organizations: No    Attends Engineer, structural: Not on file    Marital Status: Divorced  Intimate Partner Violence: Not on file    Outpatient Medications Prior to Visit  Medication Sig Dispense Refill   Multiple Vitamin (MULTI-VITAMIN) tablet Take 1 tablet by mouth daily. (Patient not taking: Reported on 11/13/2022)     SUMAtriptan (IMITREX) 50 MG tablet Take 1 tablet by mouth as directed. May repeat in 2 hours if headache persists or recurs. (Patient not taking: Reported on 11/13/2022) 10 tablet 5   Ascorbic Acid (VITAMIN C) 100 MG tablet Take by mouth. (Patient not taking: Reported on 11/13/2022)     Cholecalciferol 125 MCG (5000 UT) TABS Take by mouth. (Patient not taking:  Reported on 11/13/2022)     citalopram (CELEXA) 20 MG tablet Take 1 tablet (20 mg total) by mouth daily. (Patient not taking: Reported on 11/13/2022) 90 tablet 0   meloxicam (MOBIC) 7.5 MG tablet Take 1 tablet (7.5 mg total) by mouth daily as needed for pain. (Patient not taking: Reported on 11/13/2022) 14 tablet 0   TDaP (BOOSTRIX) injection 0.5 mL      No facility-administered medications prior to visit.    No Known Allergies  Review of Systems  Constitutional:  Negative for weight loss.  HENT:  Negative for congestion and hearing loss.   Eyes:  Negative  for blurred vision.  Respiratory:  Positive for cough (mild).   Cardiovascular:  Negative for leg swelling.  Gastrointestinal:  Negative for constipation and diarrhea.  Genitourinary:  Negative for dysuria and frequency.  Musculoskeletal:  Positive for joint pain (right elbow).  Neurological:  Positive for headaches (weekly).  Psychiatric/Behavioral:  Positive for depression.        Objective:    Physical Exam   BP 123/68 (BP Location: Left Arm, Patient Position: Sitting, Cuff Size: Large)   Pulse 89   Resp 16   Wt 225 lb (102.1 kg)   LMP 02/09/2015   SpO2 99%   BMI 32.28 kg/m  Wt Readings from Last 3 Encounters:  11/13/22 225 lb (102.1 kg)  08/25/22 228 lb (103.4 kg)  07/14/22 230 lb (104.3 kg)   Physical Exam  Constitutional: She is oriented to person, place, and time. She appears well-developed and well-nourished. No distress.  HENT:  Head: Normocephalic and atraumatic.  Right Ear: Tympanic membrane and ear canal normal.  Left Ear: Tympanic membrane and ear canal normal.  Mouth/Throat: Oropharynx is clear and moist.  Eyes: Pupils are equal, round, and reactive to light. No scleral icterus.  Neck: Normal range of motion. No thyromegaly present.  Cardiovascular: Normal rate and regular rhythm.   No murmur heard. Pulmonary/Chest: Effort normal and breath sounds normal. No respiratory distress. He has no wheezes.  She has no rales. She exhibits no tenderness.  Abdominal: Soft. Bowel sounds are normal. She exhibits no distension and no mass. There is no tenderness. There is no rebound and no guarding.  Musculoskeletal: She exhibits no edema.  Lymphadenopathy:    She has no cervical adenopathy.  Neurological: She is alert and oriented to person, place, and time. She has normal patellar reflexes. She exhibits normal muscle tone. Coordination normal.  Skin: Skin is warm and dry.  Psychiatric: She is tearful. Her behavior is normal. Judgment and thought content normal.  Breasts: Examined lying Right: Without masses, retractions, discharge or axillary adenopathy.  Left: Without masses, retractions, discharge or axillary adenopathy.  Inguinal/mons: Normal without inguinal adenopathy  External genitalia: Normal  BUS/Urethra/Skene's glands: Normal  Bladder: Normal  Vagina: Normal  Cervix: Normal  Uterus: normal in size, shape and contour. Midline and mobile  Adnexa/parametria:  Rt: Without masses or tenderness.  Lt: Without masses or tenderness.  Anus and perineum: Normal            Assessment & Plan:        Assessment & Plan:   Problem List Items Addressed This Visit       Unprioritized   Thyroid nodule    Scheduled for follow up thyroid US on 8/26 per surgeon. Request TSH to be drawn here for convenience today.       Relevant Orders   T3, free   T4, free   TSH   Preventative health care - Primary    -Order mammogram and schedule on patient's way out. -Repeat Pap smear this year due to previous abnormal HPV result. -Consider colonoscopy in 2026. Had neg cologuard 2023 -Encouraged to get updated flu and COVID vaccines at the pharmacy in about a month.      Osteoarthritis     Reports occasional pain in arm and leg. Previously used Meloxicam with good effect. -Provide Meloxicam to use as needed for flare-ups.      Relevant Medications   meloxicam (MOBIC) 7.5 MG tablet    Obesity (BMI 30.0-34.9)    Frustration with lack of  weight loss despite dietary changes and exercise. Reports discontinuing Citalopram in an attempt to facilitate weight loss, but that did not help. -Encouraged to continue with dietary changes and exercise. -Referral to weight loss clinic- already scheduled.       Musculoskeletal arm pain, right   Relevant Medications   meloxicam (MOBIC) 7.5 MG tablet   Migraines     Reports experiencing migraines at least once a week. Has Imitrex on hand but has not used it yet. -Continue to use Imitrex as needed for migraines. -Consider daily preventive medication if migraines become more frequent or more severe.       Relevant Medications   citalopram (CELEXA) 10 MG tablet   meloxicam (MOBIC) 7.5 MG tablet   Hot flashes    No longer experiencing multiple hot flashes daily. -No further intervention needed at this time.      Depression    Abrupt discontinuation of Citalopram 20mg  daily three weeks ago. Reports feeling angry, stressed, and crying frequently. Discussed the importance of tapering off the medication and the potential for withdrawal symptoms with abrupt discontinuation. -Resume Citalopram at a lower dose of 10mg  daily. -Reevaluate in one month and consider increasing to 20mg  daily if mood remains low.      Relevant Medications   citalopram (CELEXA) 10 MG tablet   Other Visit Diagnoses     Breast cancer screening by mammogram       Relevant Orders   MM 3D SCREENING MAMMOGRAM BILATERAL BREAST   Hypertriglyceridemia       Relevant Orders   Lipid panel   Encounter for routine gynecological examination with Papanicolaou smear of cervix       Relevant Orders   Cytology - PAP( [place])       I have discontinued Khristine E. Acri "Candy"'s vitamin C, Cholecalciferol, and citalopram. I am also having her start on citalopram. Additionally, I am having her maintain her Multi-Vitamin, SUMAtriptan, and meloxicam. We will stop  administering Tdap.  Meds ordered this encounter  Medications   citalopram (CELEXA) 10 MG tablet    Sig: Take 1 tablet (10 mg total) by mouth daily.    Dispense:  90 tablet    Refill:  1    Order Specific Question:   Supervising Provider    Answer:   Danise Edge A [[id number]]   meloxicam (MOBIC) 7.5 MG tablet    Sig: Take 1 tablet (7.5 mg total) by mouth daily as needed for pain.    Dispense:  30 tablet    Refill:  1    Order Specific Question:   Supervising Provider    Answer:   Danise Edge A [[id number]]

## 2022-11-13 NOTE — Assessment & Plan Note (Signed)
Scheduled for follow up thyroid US on 8/26 per surgeon. Request TSH to be drawn here for convenience today.

## 2022-11-13 NOTE — Assessment & Plan Note (Signed)
Abrupt discontinuation of Citalopram 20mg  daily three weeks ago. Reports feeling angry, stressed, and crying frequently. Discussed the importance of tapering off the medication and the potential for withdrawal symptoms with abrupt discontinuation. -Resume Citalopram at a lower dose of 10mg  daily. -Reevaluate in one month and consider increasing to 20mg  daily if mood remains low.

## 2022-11-13 NOTE — Assessment & Plan Note (Signed)
-  Order mammogram and schedule on patient's way out. -Repeat Pap smear this year due to previous abnormal HPV result. -Consider colonoscopy in 2026. Had neg cologuard 2023 -Encouraged to get updated flu and COVID vaccines at the pharmacy in about a month.

## 2022-11-13 NOTE — Patient Instructions (Signed)
VISIT SUMMARY:  During your visit, we discussed your ongoing struggles with depression, weight loss, menopausal symptoms, migraines, von Willebrand disease, and arthritis. You mentioned that you had stopped taking your depression medication, citalopram, which seems to have worsened your emotional distress. We also talked about your efforts to lose weight, your improvement in hot flashes, your weekly migraines, and your occasional arthritis pain.  YOUR PLAN:  -DEPRESSION: Depression is a mood disorder that causes a persistent feeling of sadness and loss of interest. We decided that you should resume taking citalopram at a lower dose of 10mg  daily. We will reevaluate in one month and consider increasing the dose if your mood remains low.  -MENOPAUSAL SYMPTOMS: Menopause is the time that marks the end of your menstrual cycles. You reported significant improvement in hot flashes.  At this time, no further intervention is needed.  -WEIGHT LOSS: Despite your efforts to lose weight through dietary changes and exercise, you have not seen significant results. We encourage you to continue with these changes and have referred you to a weight loss clinic for further support.  -MIGRAINES: Migraines are severe headaches that can cause intense throbbing or a pulsing sensation. You should continue to use Imitrex as needed for migraines. If your migraines become more frequent and severe, we may consider a daily preventive medication.  -VON WILLEBRAND DISEASE: Von Willebrand disease is a lifelong bleeding disorder. You reported increased bleeding, so we suggest a follow-up with Dr. Pearlie Oyster who is currently managing your condition.  -ARTHRITIS: Arthritis is inflammation of one or more of your joints that causes pain and stiffness. We will provide Meloxicam for you to use as needed for flare-ups.  INSTRUCTIONS:  For your general health maintenance, we have ordered a TSH test for today and scheduled a mammogram. We  also recommend repeating your Pap smear this year due to a previous abnormal HPV result. A colonoscopy should be considered in 2026. We encourage you to get updated flu and COVID vaccines at the pharmacy in about a month. Please follow up in three months.

## 2022-11-16 ENCOUNTER — Other Ambulatory Visit (HOSPITAL_BASED_OUTPATIENT_CLINIC_OR_DEPARTMENT_OTHER): Payer: Self-pay

## 2022-11-16 ENCOUNTER — Ambulatory Visit
Admission: RE | Admit: 2022-11-16 | Discharge: 2022-11-16 | Disposition: A | Discharge status: Home / Self Care | Payer: BC Managed Care – PPO | Source: Ambulatory Visit | Attending: Surgery | Admitting: Surgery

## 2022-11-16 DIAGNOSIS — E041 Nontoxic single thyroid nodule: Secondary | ICD-10-CM | POA: Diagnosis not present

## 2022-11-17 ENCOUNTER — Other Ambulatory Visit (HOSPITAL_BASED_OUTPATIENT_CLINIC_OR_DEPARTMENT_OTHER): Payer: Self-pay

## 2022-11-18 LAB — CYTOLOGY - PAP
Adequacy: ABSENT
Comment: NEGATIVE
Diagnosis: NEGATIVE
High risk HPV: NEGATIVE

## 2022-11-19 ENCOUNTER — Telehealth (INDEPENDENT_AMBULATORY_CARE_PROVIDER_SITE_OTHER): Payer: Self-pay | Admitting: Family Medicine

## 2022-11-19 NOTE — Telephone Encounter (Signed)
8/29 Called Patient to reschedule appointment on 10/8 Dr. Val Eagle will be out of town.

## 2022-11-25 ENCOUNTER — Ambulatory Visit (INDEPENDENT_AMBULATORY_CARE_PROVIDER_SITE_OTHER): Payer: Self-pay | Admitting: Internal Medicine

## 2022-11-30 ENCOUNTER — Ambulatory Visit (INDEPENDENT_AMBULATORY_CARE_PROVIDER_SITE_OTHER): Payer: Self-pay | Admitting: Internal Medicine

## 2022-12-07 ENCOUNTER — Telehealth (HOSPITAL_BASED_OUTPATIENT_CLINIC_OR_DEPARTMENT_OTHER): Payer: Self-pay

## 2022-12-08 ENCOUNTER — Ambulatory Visit (INDEPENDENT_AMBULATORY_CARE_PROVIDER_SITE_OTHER): Payer: BC Managed Care – PPO | Admitting: Family Medicine

## 2022-12-08 ENCOUNTER — Encounter (INDEPENDENT_AMBULATORY_CARE_PROVIDER_SITE_OTHER): Payer: Self-pay | Admitting: Family Medicine

## 2022-12-08 VITALS — BP 119/77 | HR 70 | Temp 98.4°F | Ht 70.0 in | Wt 220.0 lb

## 2022-12-08 DIAGNOSIS — G43909 Migraine, unspecified, not intractable, without status migrainosus: Secondary | ICD-10-CM

## 2022-12-08 DIAGNOSIS — R0602 Shortness of breath: Secondary | ICD-10-CM

## 2022-12-08 DIAGNOSIS — E559 Vitamin D deficiency, unspecified: Secondary | ICD-10-CM | POA: Diagnosis not present

## 2022-12-08 DIAGNOSIS — F5089 Other specified eating disorder: Secondary | ICD-10-CM | POA: Diagnosis not present

## 2022-12-08 DIAGNOSIS — Z1331 Encounter for screening for depression: Secondary | ICD-10-CM | POA: Diagnosis not present

## 2022-12-08 DIAGNOSIS — E66811 Obesity, class 1: Secondary | ICD-10-CM

## 2022-12-08 DIAGNOSIS — E041 Nontoxic single thyroid nodule: Secondary | ICD-10-CM | POA: Diagnosis not present

## 2022-12-08 DIAGNOSIS — R5383 Other fatigue: Secondary | ICD-10-CM

## 2022-12-08 DIAGNOSIS — F39 Unspecified mood [affective] disorder: Secondary | ICD-10-CM

## 2022-12-08 DIAGNOSIS — E669 Obesity, unspecified: Secondary | ICD-10-CM

## 2022-12-08 DIAGNOSIS — Z6831 Body mass index (BMI) 31.0-31.9, adult: Secondary | ICD-10-CM

## 2022-12-08 NOTE — Progress Notes (Signed)
Pamela [name], D.O.  ABFM, ABOM Specializing in Clinical Bariatric Medicine Office located at: [address]  [place], [place]  [address]     Bariatric Medicine Visit  Dear Pamela Craze, NP   Thank you for referring Pamela [name] to our clinic today for evaluation.  We performed a consultation to discuss her options for treatment and educate the patient on her disease state.  The following note includes my evaluation and treatment recommendations.   Please do not hesitate to reach out to me directly if you have any further concerns.   Assessment and Plan:   Orders Placed This Encounter  Procedures   Vitamin B12   Comprehensive metabolic panel   Folate   Hemoglobin A1c   Insulin, random   VITAMIN D 25 Hydroxy (Vit-D Deficiency, Fractures)   CBC with Differential/Platelet    Fatigue Assessment & Plan: Pamela [name] does feel that her [name] is causing her energy to be lower than it should be. Fatigue may be related to Pamela, depression or many other causes. she does not appear to have any red flag symptoms and this appears to most likely be related to her current lifestyle habits and dietary intake.  Labs will be ordered and reviewed with her at their next office visit in two weeks.  Epworth sleepiness scale is 4 & appears to be within normal limits. Pamela [name] denies daytime somnolence and admits to waking up still tired. Patient has a history of symptoms of morning headache (3 x per month). Pamela [name] generally gets  7-9  hours of sleep per night. Snoring is not present. Apneic episodes is not present.   Pt had complete ECHO for 10/06/22 with results below. She is followed regularly by Dr. Donato Schultz of Cardiology.    IMPRESSIONS    1. Left ventricular ejection fraction, by estimation, is 65 to 70%. Left  ventricular ejection fraction by 3D volume is 69 %. The left ventricle has  normal function. The left ventricle has no regional wall motion  abnormalities.  Left ventricular diastolic   parameters were normal.   2. Right ventricular systolic function is normal. The right ventricular  size is normal. There is normal pulmonary artery systolic pressure. The  estimated right ventricular systolic pressure is 25.5 mmHg.   3. Right atrial size was mildly dilated.   4. The mitral valve is normal in structure. Mild mitral valve  regurgitation. No evidence of mitral stenosis.   5. The aortic valve is normal in structure. Aortic valve regurgitation is  not visualized. No aortic stenosis is present.   6. There is mild dilatation of the aortic root, measuring 38 mm.   7. The inferior vena cava is normal in size with greater than 50%  respiratory variability, suggesting right atrial pressure of 3 mmHg.   FINDINGS   Left Ventricle: Left ventricular ejection fraction, by estimation, is 65  to 70%. Left ventricular ejection fraction by 3D volume is 69 %. The left  ventricle has normal function. The left ventricle has no regional wall  motion abnormalities. The left  ventricular internal cavity size was normal in size. There is no left  ventricular hypertrophy. Left ventricular diastolic parameters were  normal. Normal left ventricular filling pressure.   Right Ventricle: The right ventricular size is normal. No increase in  right ventricular wall thickness. Right ventricular systolic function is  normal. There is normal pulmonary artery systolic pressure. The tricuspid  regurgitant velocity is 2.37 m/s, and   with an  assumed right atrial pressure of 3 mmHg, the estimated right  ventricular systolic pressure is 25.5 mmHg.   Left Atrium: Left atrial size was normal in size.   Right Atrium: Right atrial size was mildly dilated.   Pericardium: There is no evidence of pericardial effusion.   Mitral Valve: The mitral valve is normal in structure. Mild mitral valve  regurgitation. No evidence of mitral valve stenosis.   Tricuspid Valve: The tricuspid  valve is normal in structure. Tricuspid  valve regurgitation is trivial. No evidence of tricuspid stenosis.   Aortic Valve: The aortic valve is normal in structure. Aortic valve  regurgitation is not visualized. No aortic stenosis is present.   Pulmonic Valve: The pulmonic valve was normal in structure. Pulmonic valve  regurgitation is mild. No evidence of pulmonic stenosis.   Aorta: The aortic root is normal in size and structure. There is mild  dilatation of the aortic root, measuring 38 mm.   Venous: The inferior vena cava is normal in size with greater than 50%  respiratory variability, suggesting right atrial pressure of 3 mmHg.   IAS/Shunts: No atrial level shunt detected by color flow Doppler.    Shortness of breath on exertion Assessment & Plan: Pamela [name] does feel that she gets out of breath more easily than she used to when she exercises and seems to be worsening over time with [name] gain.  This has gotten worse recently. Pamela [name] denies shortness of breath at rest or orthopnea. Pamela [name]'s shortness of breath appears to be Pamela related and exercise induced, as they do not appear to have any "red flag" symptoms/ concerns today.  Also, this condition appears to be related to a state of poor cardiovascular conditioning   Obtain labs today and will be reviewed with her at their next office visit in two weeks.  Indirect Calorimeter completed today to help guide our dietary regimen. It shows a VO2 of 256 and a REE of 1771.  Her calculated basal metabolic rate is 1610  thus her measured basal metabolic rate is worse than expected.  Patient agreed to work on [name] loss at this time.  As [name] progresses through our [name] loss program, we will gradually increase exercise as tolerated to treat her current condition.   If Pamela [name] follows our recommendations and loses 5-10% of their [name] without improvement of her shortness of breath or if at any time, symptoms become more  concerning, they agree to urgently follow up with their PCP/ specialist for further consideration/ evaluation.   Pamela [name] verbalizes agreement with this plan.    Mood disorder (HCC) - emotional eating Assessment & Plan: Modified PHQ-9 Depression Screen: Her Food and Mood (modified PHQ-9) score was 19. Amreen reports having a hx of depression, anxiety, and panic attacks. Pt had a counselor in the past, but no longer sees one. She feels that her mood is stable. Reports tending to eat when stressed, bored, as a reward, and to help comfort herself. Current medication: Celexa 10 mg daily.   Pauletta was informed about Dr. Dewaine Conger, our Bariatric Psychologist, for evaluation due to her elevated PHQ-9 score and struggles with emotional eating. She declined a referral for today, but may consider meeting with Dr.Barker in the future. C/w Celexa per PCP. We will begin to monitor closely alongside PCP / other specialists.   Migraine without status migrainosus, not intractable, unspecified migraine type Assessment & Plan Migraines are stable. Has Imitrex on hand but reports only using it once in the past yr. C/w treatment  and recommendations per PCP.    Thyroid nodule Assessment & Plan: Lab Results  Component Value Date   TSH 1.90 11/13/2022   FREET4 0.75 11/13/2022    Had a thyroid nodule detected in 2023. Condition is stable - nodule has decreased in size per pt. She is not any medication for this condition. C/w recommendations and f/up with PCP/specialist.    Vitamin D deficiency Assessment & Plan: Condition treated with OTC Vitamin D 5,000 units daily. C/w with current supplementation regiment. We will check labs today.   Pamela (BMI 30.0-34.9) Assessment & Plan: Muscle mass is  125.6 lbs. Fat mass is 88.4 lbs .Total body water is 87.4 lbs.  Alexianna will work on healthier eating habits and try their best to follow the Category 1 meal plan with 6 ounces at lunch and 6-8 ounces at dinner &  journaling her intake.   Behavioral Intervention Additional resources provided today: category 1 meal plan information and Food journaling plan information Evidence-based interventions for health behavior change were utilized today including the discussion of self monitoring techniques, problem-solving barriers and SMART goal setting techniques.   Regarding patient's less desirable eating habits and patterns, we employed the technique of small changes.  Pt will specifically work on: beginning prescribed meal plan & weighing all lean protein/veggies for next visit.    FOLLOW UP: Follow up in 2 weeks. She was informed of the importance of frequent follow up visits to maximize her success with intensive lifestyle modifications for her multiple health conditions.  Pamela [name] is aware that we will review all of her lab results at our next visit.  She is aware that if anything is critical/ life threatening with the results, we will be contacting her via MyChart prior to the office visit to discuss management.    Chief Complaint:   Pamela [name] (MR# [id number]) is a pleasant  52 y.o. female who presents for evaluation and treatment of Pamela and related comorbidities. Current BMI is Body mass index is 31.57 kg/m. Pamela [name] has been struggling with her [name] for many years and has been unsuccessful in either losing [name], maintaining [name] loss, or reaching her healthy [name] goal.  Pamela [name] is currently in the action stage of change and ready to dedicate time achieving and maintaining a healthier [name]. Pamela [name] is interested in becoming our patient and working on intensive lifestyle modifications including (but not limited to) diet and exercise for [name] loss.  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 Patient is single and does not have children. She lives alone.  Walks 30-40 minutes, 4 x a wk.   Reasons she attributes to  [name] gain: medications, stress & depression.   She has tried keto and low carb diets in the past.   Eats out 3-4 times a wk.   Does not like to cook.   Craves sugar and sweets especially at night and when stressed.  Snacks on "crunchy items"   Dislikes are fruits and meats.   Skips breakfast on most days.   Drinks several SSBs: starbucks mocha frappucino, juice, coffee with creamer, and premier protein drinks.   Worst food habit: snacking.   Subjective:   This is the patient's first visit at [name].  The patient's NEW PATIENT PACKET that they filled out prior to today's office visit was reviewed at length and information from that paperwork was included within the following office visit note.  Included in the packet: current and past health history, medications, allergies, ROS, gynecologic history (women only), surgical history, family history, social history, [name] history, [name] loss surgery history (for those that have had [name] loss surgery), nutritional evaluation, mood and food questionnaire along with a depression screening (PHQ9) on all patients, an Epworth questionnaire, sleep habits questionnaire, patient life and health improvement goals questionnaire. These will all be scanned into the patient's chart under the "media" tab.   Review of Systems: Please refer to new patient packet scanned into media. Pertinent positives were addressed with patient today.  Reviewed by clinician on day of visit: allergies, medications, problem list, medical history, surgical history, family history, social history, and previous encounter notes.  During the visit, I independently reviewed the patient's EKG, bioimpedance scale results, and indirect calorimeter results. I used this information to tailor a meal plan for the patient that will help Pamela [name] and will improve her Pamela-related conditions going forward.  I performed a medically  necessary appropriate examination and/or evaluation. I discussed the assessment and treatment plan with the patient. The patient was provided an opportunity to ask questions and all were answered. The patient agreed with the plan and demonstrated an understanding of the instructions. Labs were ordered today (unless patient declined them) and will be reviewed with the patient at our next visit unless more critical results need to be addressed immediately. Clinical information was updated and documented in the EMR.   Objective:   PHYSICAL EXAM: Blood pressure 119/77, pulse 70, temperature 98.4 F (36.9 C), height 5\' 10"  (1.778 m), [name] 220 lb (99.8 kg), last menstrual period 02/09/2015, SpO2 98%. Body mass index is 31.57 kg/m. General: Well Developed, well nourished, and in no acute distress.  HEENT: Normocephalic, atraumatic Skin: Warm and dry, cap RF less 2 sec, good turgor Chest:  Normal excursion, shape, no gross abn Respiratory: speaking in full sentences, no conversational dyspnea NeuroM-Sk: Ambulates w/o assistance, moves * 4 Psych: A and O *3, insight good, mood-full  Anthropometric Measurements Height: 5\' 10"  (1.778 m) [name]: 220 lb (99.8 kg) BMI (Calculated): 31.57 Starting [name]: 220 lb Peak [name]: 231 lb Waist Measurement : 42 inches   Body Composition  Body Fat %: 40.1 % Fat Mass (lbs): 88.4 lbs Muscle Mass (lbs): 125.6 lbs Total Body Water (lbs): 87.4 lbs Visceral Fat Rating : 10   Other Clinical Data RMR: [id number] Fasting: yes Labs: yes Today's Visit #: 1 Starting Date: 12/08/22    DIAGNOSTIC DATA REVIEWED:  BMET    Component Value Date/Time   NA 136 09/30/2021 1548   K 4.5 09/30/2021 1548   CL 100 09/30/2021 1548   CO2 29 09/30/2021 1548   GLUCOSE 84 09/30/2021 1548   BUN 19 09/30/2021 1548   CREATININE 0.87 09/30/2021 1548   CREATININE 0.64 07/30/2014 1114   CALCIUM 9.6 09/30/2021 1548   GFRNONAA >90 10/20/2013 1048   GFRNONAA >89 09/10/2011  1136   GFRAA >90 10/20/2013 1048   GFRAA >89 09/10/2011 1136   Lab Results  Component Value Date   HGBA1C 5.4 09/30/2021   No results found for: "INSULIN" Lab Results  Component Value Date   TSH 1.90 11/13/2022   CBC    Component Value Date/Time   WBC 5.2 09/30/2021 1548   RBC 4.63 09/30/2021 1548   HGB 13.2 09/30/2021 1548   HGB 12.9 10/13/2013 1021   HCT 39.0 09/30/2021 1548   HCT 37.4 10/13/2013 1021   PLT 210.0 09/30/2021 1548  PLT 159 10/13/2013 1021   MCV 84.2 09/30/2021 1548   MCV 86 10/13/2013 1021   MCH 28.3 07/30/2014 1114   MCHC 33.9 09/30/2021 1548   RDW 13.2 09/30/2021 1548   RDW 12.3 10/13/2013 1021   Iron Studies No results found for: "IRON", "TIBC", "FERRITIN", "IRONPCTSAT" Lipid Panel     Component Value Date/Time   CHOL 170 11/13/2022 1047   TRIG 151.0 (H) 11/13/2022 1047   HDL 45.50 11/13/2022 1047   CHOLHDL 4 11/13/2022 1047   VLDL 30.2 11/13/2022 1047   LDLCALC 94 11/13/2022 1047   Hepatic Function Panel     Component Value Date/Time   PROT 7.6 09/30/2021 1548   ALBUMIN 4.6 09/30/2021 1548   AST 22 09/30/2021 1548   ALT 20 09/30/2021 1548   ALKPHOS 92 09/30/2021 1548   BILITOT 0.6 09/30/2021 1548      Component Value Date/Time   TSH 1.90 11/13/2022 1047   Nutritional No results found for: "VD25OH"  Attestation Statements:   I, Special Puri, acting as a Stage manager for [employer], DO., have compiled all relevant documentation for today's office visit on behalf of Thomasene Lot, DO, while in the presence of [employer], DO.  Time spent on visit including pre-visit chart review and post-visit care was estimated to be 60  minutes. Over 50% of the time was spent in direct face to face counseling and coordination of care.  I have reviewed the above documentation for accuracy and completeness, and I agree with the above. Pamela [name], D.O.  The 21st Century Cures Act was signed into law in 2016 which includes the  topic of electronic health records.  This provides immediate access to information in MyChart.  This includes consultation notes, operative notes, office notes, lab results and pathology reports.  If you have any questions about what you read please let us know at your next visit so we can discuss your concerns and take corrective action if need be.  We are right here with you.

## 2022-12-11 LAB — COMPREHENSIVE METABOLIC PANEL
ALT: 19 IU/L (ref 0–32)
AST: 22 IU/L (ref 0–40)
Albumin: 4.3 g/dL (ref 3.8–4.9)
Alkaline Phosphatase: 97 IU/L (ref 44–121)
BUN/Creatinine Ratio: 20 (ref 9–23)
BUN: 17 mg/dL (ref 6–24)
Bilirubin Total: 0.7 mg/dL (ref 0.0–1.2)
CO2: 23 mmol/L (ref 20–29)
Calcium: 9.5 mg/dL (ref 8.7–10.2)
Chloride: 101 mmol/L (ref 96–106)
Creatinine, Ser: 0.85 mg/dL (ref 0.57–1.00)
Globulin, Total: 3.1 g/dL (ref 1.5–4.5)
Glucose: 88 mg/dL (ref 70–99)
Potassium: 4.4 mmol/L (ref 3.5–5.2)
Sodium: 138 mmol/L (ref 134–144)
Total Protein: 7.4 g/dL (ref 6.0–8.5)
eGFR: 81 mL/min/{1.73_m2} (ref >59)

## 2022-12-11 LAB — CBC WITH DIFFERENTIAL/PLATELET
Basophils Absolute: 0 10*3/uL (ref 0.0–0.2)
Basos: 1 %
EOS (ABSOLUTE): 0.1 10*3/uL (ref 0.0–0.4)
Eos: 3 %
Hematocrit: 41.4 % (ref 34.0–46.6)
Hemoglobin: 13.3 g/dL (ref 11.1–15.9)
Immature Grans (Abs): 0 10*3/uL (ref 0.0–0.1)
Immature Granulocytes: 1 %
Lymphocytes Absolute: 1 10*3/uL (ref 0.7–3.1)
Lymphs: 23 %
MCH: 28.1 pg (ref 26.6–33.0)
MCHC: 32.1 g/dL (ref 31.5–35.7)
MCV: 87 fL (ref 79–97)
Monocytes Absolute: 0.4 10*3/uL (ref 0.1–0.9)
Monocytes: 9 %
Neutrophils Absolute: 2.7 10*3/uL (ref 1.4–7.0)
Neutrophils: 63 %
Platelets: 202 10*3/uL (ref 150–450)
RBC: 4.74 x10E6/uL (ref 3.77–5.28)
RDW: 12.9 % (ref 11.7–15.4)
WBC: 4.2 10*3/uL (ref 3.4–10.8)

## 2022-12-11 LAB — FOLATE: Folate: 10.2 ng/mL (ref >3.0)

## 2022-12-11 LAB — HEMOGLOBIN A1C
Est. average glucose Bld gHb Est-mCnc: 111 mg/dL
Hgb A1c MFr Bld: 5.5 % (ref 4.8–5.6)

## 2022-12-11 LAB — VITAMIN B12: Vitamin B-12: 306 pg/mL (ref 232–1245)

## 2022-12-11 LAB — VITAMIN D 25 HYDROXY (VIT D DEFICIENCY, FRACTURES): Vit D, 25-Hydroxy: 47.6 ng/mL (ref 30.0–100.0)

## 2022-12-11 LAB — INSULIN, RANDOM: INSULIN: 9.1 u[IU]/mL (ref 2.6–24.9)

## 2022-12-15 DIAGNOSIS — L821 Other seborrheic keratosis: Secondary | ICD-10-CM | POA: Diagnosis not present

## 2022-12-15 DIAGNOSIS — D171 Benign lipomatous neoplasm of skin and subcutaneous tissue of trunk: Secondary | ICD-10-CM | POA: Diagnosis not present

## 2022-12-15 DIAGNOSIS — Z85828 Personal history of other malignant neoplasm of skin: Secondary | ICD-10-CM | POA: Diagnosis not present

## 2022-12-15 DIAGNOSIS — L280 Lichen simplex chronicus: Secondary | ICD-10-CM | POA: Diagnosis not present

## 2022-12-15 DIAGNOSIS — Z08 Encounter for follow-up examination after completed treatment for malignant neoplasm: Secondary | ICD-10-CM | POA: Diagnosis not present

## 2022-12-15 DIAGNOSIS — D1801 Hemangioma of skin and subcutaneous tissue: Secondary | ICD-10-CM | POA: Diagnosis not present

## 2022-12-28 ENCOUNTER — Other Ambulatory Visit: Payer: Self-pay

## 2022-12-29 ENCOUNTER — Ambulatory Visit (INDEPENDENT_AMBULATORY_CARE_PROVIDER_SITE_OTHER): Payer: BC Managed Care – PPO | Admitting: Family Medicine

## 2022-12-29 ENCOUNTER — Encounter (INDEPENDENT_AMBULATORY_CARE_PROVIDER_SITE_OTHER): Payer: Self-pay | Admitting: Family Medicine

## 2022-12-29 VITALS — BP 118/76 | HR 65 | Temp 98.3°F | Ht 70.0 in | Wt 216.0 lb

## 2022-12-29 DIAGNOSIS — Z6831 Body mass index (BMI) 31.0-31.9, adult: Secondary | ICD-10-CM

## 2022-12-29 DIAGNOSIS — E669 Obesity, unspecified: Secondary | ICD-10-CM

## 2022-12-29 DIAGNOSIS — E88819 Insulin resistance, unspecified: Secondary | ICD-10-CM | POA: Diagnosis not present

## 2022-12-29 DIAGNOSIS — E559 Vitamin D deficiency, unspecified: Secondary | ICD-10-CM | POA: Diagnosis not present

## 2022-12-29 MED ORDER — VITAMIN D-3 125 MCG (5000 UT) PO TABS: ORAL_TABLET | ORAL | Status: AC

## 2022-12-29 NOTE — Progress Notes (Signed)
Chief Complaint:   OBESITY Pamela [name] is here to discuss her progress with her obesity treatment plan along with follow-up of her obesity related diagnoses. Pamela [name] is on the Category 1 Plan with 6 oz of protein at lunch and 6-8 oz of protein at dinner and states she is following her eating plan approximately 90% of the time. Pamela [name] states she is 10,000-12,000 steps 4-5 times per week.    Today's visit was #: 2 Starting weight: 220 lbs Starting date: 12/08/2022 Today's weight: 216 lbs Today's date: 12/29/2022 Total lbs lost to date: 4 Total lbs lost since last in-office visit: 4  Interim History: First  follow up appointment.  She is feels the meal plan is good. She is trying to follow the plan more consistently and tweaked some of the food she was already eating. She is eating probably between 1200-1500 calories a day but is not logging. She is hungry at appropriate times. She mentions she is taking citalopram medication for hot flashes and menopause symptoms.   Subjective:   1. Vitamin D deficiency Patient is on a OTC vitamin D 5000 IU daily, and she notes fatigue.  Her recent vitamin D level was 47.6.  2. Insulin resistance Patient's recent A1c was 5.5 and insulin 9.1.  Patient is much more conscious of her food intake and she is being mindful of her nutrition.  She does seem scale oriented.  Assessment/Plan:   1. Vitamin D deficiency Patient will continue OTC and increase to 10,000 IU every other day.   - Cholecalciferol (VITAMIN D-3) 125 MCG (5000 UT) TABS; Take 10,000 Int'l Units by mouth every Monday,Wednesday,Friday, and Sunday at 6 PM AND 5,000 Int'l Units every Tuesday, Thursday, and Saturday at 6 PM.  2. Insulin resistance Abbreviated pathophysiology of insulin resistance, prediabetes, and diabetes mellitus were discussed with the patient today.  Patient still has the drive for starchy carbohydrates.  3. BMI 31.0-31.9,adult  4. Obesity with starting BMi of  31.5 Pamela [name] is currently in the action stage of change. As such, her goal is to continue with weight loss efforts. She has agreed to the Category 2 Plan + 100 calories.   Exercise goals: All adults should avoid inactivity. Some physical activity is better than none, and adults who participate in any amount of physical activity gain some health benefits.  Behavioral modification strategies: increasing lean protein intake, meal planning and cooking strategies, keeping healthy foods in the home, and planning for success.  Pamela [name] has agreed to follow-up with our clinic in 2 to 3 weeks with Dr. Sharee Holster. She was informed of the importance of frequent follow-up visits to maximize her success with intensive lifestyle modifications for her multiple health conditions.   Objective:   Blood pressure 118/76, pulse 65, temperature 98.3 F (36.8 C), height 5\' 10"  (1.778 m), weight 216 lb (98 kg), last menstrual period 02/09/2015, SpO2 98%. Body mass index is 30.99 kg/m.  General: Cooperative, alert, well developed, in no acute distress. HEENT: Conjunctivae and lids unremarkable. Cardiovascular: Regular rhythm.  Lungs: Normal work of breathing. Neurologic: No focal deficits.   Lab Results  Component Value Date   CREATININE 0.85 12/08/2022   BUN 17 12/08/2022   NA 138 12/08/2022   K 4.4 12/08/2022   CL 101 12/08/2022   CO2 23 12/08/2022   Lab Results  Component Value Date   ALT 19 12/08/2022   AST 22 12/08/2022   ALKPHOS 97 12/08/2022   BILITOT 0.7 12/08/2022   Lab Results  Component Value Date   HGBA1C 5.5 12/08/2022   HGBA1C 5.4 09/30/2021   Lab Results  Component Value Date   INSULIN 9.1 12/08/2022   Lab Results  Component Value Date   TSH 1.90 11/13/2022   Lab Results  Component Value Date   CHOL 170 11/13/2022   HDL 45.50 11/13/2022   LDLCALC 94 11/13/2022   TRIG 151.0 (H) 11/13/2022   CHOLHDL 4 11/13/2022   Lab Results  Component Value Date   VD25OH 47.6  12/08/2022   Lab Results  Component Value Date   WBC 4.2 12/08/2022   HGB 13.3 12/08/2022   HCT 41.4 12/08/2022   MCV 87 12/08/2022   PLT 202 12/08/2022   No results found for: "IRON", "TIBC", "FERRITIN"  Attestation Statements:   Reviewed by clinician on day of visit: allergies, medications, problem list, medical history, surgical history, family history, social history, and previous encounter notes.  Time spent on visit including pre-visit chart review and post-visit care and charting was 42 minutes.   I, Burt Knack, am acting as transcriptionist for Reuben Likes, MD.  I have reviewed the above documentation for accuracy and completeness, and I agree with the above. - Reuben Likes, MD

## 2023-01-19 ENCOUNTER — Encounter (INDEPENDENT_AMBULATORY_CARE_PROVIDER_SITE_OTHER): Payer: Self-pay | Admitting: Family Medicine

## 2023-01-19 ENCOUNTER — Ambulatory Visit (INDEPENDENT_AMBULATORY_CARE_PROVIDER_SITE_OTHER): Payer: BC Managed Care – PPO | Admitting: Family Medicine

## 2023-01-19 VITALS — BP 126/77 | HR 67 | Temp 97.9°F | Ht 70.0 in | Wt 210.0 lb

## 2023-01-19 DIAGNOSIS — E88819 Insulin resistance, unspecified: Secondary | ICD-10-CM | POA: Diagnosis not present

## 2023-01-19 DIAGNOSIS — Z6831 Body mass index (BMI) 31.0-31.9, adult: Secondary | ICD-10-CM | POA: Diagnosis not present

## 2023-01-19 DIAGNOSIS — E669 Obesity, unspecified: Secondary | ICD-10-CM | POA: Diagnosis not present

## 2023-01-19 DIAGNOSIS — E559 Vitamin D deficiency, unspecified: Secondary | ICD-10-CM | POA: Diagnosis not present

## 2023-01-19 DIAGNOSIS — E66811 Obesity, class 1: Secondary | ICD-10-CM

## 2023-01-19 NOTE — Progress Notes (Unsigned)
Pamela [name], D.O.  ABFM, ABOM Specializing in Clinical Bariatric Medicine  Office located at: [address] [place], [place]  [address]     Assessment and Plan:   Insulin resistance Assessment: Condition is Not optimized.. This is diet/exercise controlled. Pt endorses her hunger and cravings are well controlled when following the meal plan. She states that she eats protein with every meal and this keeps her full throughout the day.  Lab Results  Component Value Date   HGBA1C 5.5 12/08/2022   HGBA1C 5.4 09/30/2021   INSULIN 9.1 12/08/2022    Plan: - [name] will continue to work on weight loss, exercise, via their meal plan we devised to help decrease the risk of health complications.   - Continue to decrease simple carbs/ sugars; increase fiber and proteins -> follow her meal plan.    - Explained role of simple carbs and insulin levels on hunger and cravings  - Anticipatory guidance given.    - We will recheck A1c and fasting insulin level in approximately 3 months from last check, or as deemed appropriate.    Vitamin D deficiency Assessment: Condition is Not optimized.. Patient reports good compliance and tolerance of taking OTC Cholecalciferol. She denies any adverse side effects.  Lab Results  Component Value Date   VD25OH 47.6 12/08/2022   Plan:- Continue with OTC Cholecalciferol oral supplement as directed.   - Informed patient this may be a lifelong thing, and she was encouraged to continue to take the medicine until told otherwise.     - weight loss will likely improve availability of vitamin D, thus encouraged Ettel to continue with meal plan and their weight loss efforts to further improve this condition.  Thus, we will need to monitor levels regularly (every 3-4 mo on average) to keep levels within normal limits and prevent over supplementation.   TREATMENT PLAN FOR OBESITY: Obesity with starting BMi of 31.5 Assessment:  Pamela [name] is  here to discuss her progress with her obesity treatment plan along with follow-up of her obesity related diagnoses. See Medical Weight Management Flowsheet for complete bioelectrical impedance results.  Condition is improving. Biometric data collected today, was reviewed with patient.   Since last office visit on 12/29/2022 patient's  Muscle mass has increased by 0.2lb. Fat mass has decreased by 6lb. Total body water has decreased by 3.8lb.  Counseling done on how various foods will affect these numbers and how to maximize success  Total lbs lost to date: 10 Total weight loss percentage to date: 4.55%   Plan: - Luceal will work on healthier eating habits and try to follow the Category 2 Plan as a guide with 100 snack calories and journal within the recommended goal of 1300 calories and 85+g of protein the best they can.    Behavioral Intervention Additional resources provided today:  food log and recipe Evidence-based interventions for health behavior change were utilized today including the discussion of self monitoring techniques, problem-solving barriers and SMART goal setting techniques.   Regarding patient's less desirable eating habits and patterns, we employed the technique of small changes.  Pt will specifically work on: Track and write down her food intake for next visit.    She has agreed to Continue current level of physical activity   FOLLOW UP: Return in about 22 days (around 02/10/2023).  She was informed of the importance of frequent follow up visits to maximize her success with intensive lifestyle modifications for her multiple health conditions.  Subjective:  Chief complaint: Obesity [name] is here to discuss her progress with her obesity treatment plan. She is on the Category 2 Plan with 100 snack calories and states she is following her eating plan approximately 100% of the time. She states she is walking 10,000 steps 5 days per week.  Interval History:  Pamela  E [name] is here for a follow up office visit.     Since last office visit:  Pt states that she is trying her best to follow the meal plan. She states that since seeing Dr. Marquis Lunch last time she noticed that she wasn't staying within the 1,000 calories. She finds herself her hungriest at night since she is busy during the day. Pt states that she has to walk for her mental well being. Pt eats about a hand full of M&M's occasionally and has cut back on carbs. She has been increasing her protein intake and eats protein with every meal.   We reviewed her meal plan and all questions were answered.   Review of Systems:  Pertinent positives were addressed with patient today.  Reviewed by clinician on day of visit: allergies, medications, problem list, medical history, surgical history, family history, social history, and previous encounter notes.  Weight Summary and Biometrics   Weight Lost Since Last Visit: 6lb  Weight Gained Since Last Visit: 0lb   Vitals Temp: 97.9 F (36.6 C) BP: 126/77 Pulse Rate: 67 SpO2: 99 %   Anthropometric Measurements Height: 5\' 10"  (1.778 m) Weight: 210 lb (95.3 kg) BMI (Calculated): 30.13 Weight at Last Visit: 216lb Weight Lost Since Last Visit: 6lb Weight Gained Since Last Visit: 0lb Starting Weight: 220 lb Total Weight Loss (lbs): 10 lb (4.536 kg) Peak Weight: 231lb   Body Composition  Body Fat %: 38 % Fat Mass (lbs): 80.2 lbs Muscle Mass (lbs): 124 lbs Total Body Water (lbs): 834 lbs Visceral Fat Rating : 9   Other Clinical Data Fasting: no Labs: no Today's Visit #: 3 Starting Date: 12/08/22     Objective:   PHYSICAL EXAM: Blood pressure 126/77, pulse 67, temperature 97.9 F (36.6 C), height 5\' 10"  (1.778 m), weight 210 lb (95.3 kg), last menstrual period 02/09/2015, SpO2 99%. Body mass index is 30.13 kg/m.  General: Well Developed, well nourished, and in no acute distress.  HEENT: Normocephalic, atraumatic Skin: Warm and dry, cap  RF less 2 sec, good turgor Chest:  Normal excursion, shape, no gross abn Respiratory: speaking in full sentences, no conversational dyspnea NeuroM-Sk: Ambulates w/o assistance, moves * 4 Psych: A and O *3, insight good, mood-full  DIAGNOSTIC DATA REVIEWED:  BMET    Component Value Date/Time   NA 138 12/08/2022 0942   K 4.4 12/08/2022 0942   CL 101 12/08/2022 0942   CO2 23 12/08/2022 0942   GLUCOSE 88 12/08/2022 0942   GLUCOSE 84 09/30/2021 1548   BUN 17 12/08/2022 0942   CREATININE 0.85 12/08/2022 0942   CREATININE 0.64 07/30/2014 1114   CALCIUM 9.5 12/08/2022 0942   GFRNONAA >90 10/20/2013 1048   GFRNONAA >89 09/10/2011 1136   GFRAA >90 10/20/2013 1048   GFRAA >89 09/10/2011 1136   Lab Results  Component Value Date   HGBA1C 5.5 12/08/2022   HGBA1C 5.4 09/30/2021   Lab Results  Component Value Date   INSULIN 9.1 12/08/2022   Lab Results  Component Value Date   TSH 1.90 11/13/2022   CBC    Component Value Date/Time   WBC 4.2 12/08/2022 0942   WBC 5.2  09/30/2021 1548   RBC 4.74 12/08/2022 0942   RBC 4.63 09/30/2021 1548   HGB 13.3 12/08/2022 0942   HGB 12.9 10/13/2013 1021   HCT 41.4 12/08/2022 0942   HCT 37.4 10/13/2013 1021   PLT 202 12/08/2022 0942   MCV 87 12/08/2022 0942   MCV 86 10/13/2013 1021   MCH 28.1 12/08/2022 0942   MCH 28.3 07/30/2014 1114   MCHC 32.1 12/08/2022 0942   MCHC 33.9 09/30/2021 1548   RDW 12.9 12/08/2022 0942   RDW 12.3 10/13/2013 1021   Iron Studies No results found for: "IRON", "TIBC", "FERRITIN", "IRONPCTSAT" Lipid Panel     Component Value Date/Time   CHOL 170 11/13/2022 1047   TRIG 151.0 (H) 11/13/2022 1047   HDL 45.50 11/13/2022 1047   CHOLHDL 4 11/13/2022 1047   VLDL 30.2 11/13/2022 1047   LDLCALC 94 11/13/2022 1047   Hepatic Function Panel     Component Value Date/Time   PROT 7.4 12/08/2022 0942   ALBUMIN 4.3 12/08/2022 0942   AST 22 12/08/2022 0942   ALT 19 12/08/2022 0942   ALKPHOS 97 12/08/2022 0942    BILITOT 0.7 12/08/2022 0942      Component Value Date/Time   TSH 1.90 11/13/2022 1047   Nutritional Lab Results  Component Value Date   VD25OH 47.6 12/08/2022    Attestations:   I, Clinical biochemist, acting as a Stage manager for [employer], DO., have compiled all relevant documentation for today's office visit on behalf of Thomasene Lot, DO, while in the presence of [employer], DO.  I have reviewed the above documentation for accuracy and completeness, and I agree with the above. Pamela [name], D.O.  The 21st Century Cures Act was signed into law in 2016 which includes the topic of electronic health records.  This provides immediate access to information in MyChart.  This includes consultation notes, operative notes, office notes, lab results and pathology reports.  If you have any questions about what you read please let us know at your next visit so we can discuss your concerns and take corrective action if need be.  We are right here with you.

## 2023-02-10 ENCOUNTER — Ambulatory Visit (INDEPENDENT_AMBULATORY_CARE_PROVIDER_SITE_OTHER): Payer: BC Managed Care – PPO | Admitting: Family Medicine

## 2023-03-03 ENCOUNTER — Ambulatory Visit (INDEPENDENT_AMBULATORY_CARE_PROVIDER_SITE_OTHER): Payer: BC Managed Care – PPO | Admitting: Family Medicine

## 2023-05-03 ENCOUNTER — Other Ambulatory Visit (HOSPITAL_BASED_OUTPATIENT_CLINIC_OR_DEPARTMENT_OTHER): Payer: Self-pay

## 2023-05-07 ENCOUNTER — Other Ambulatory Visit (HOSPITAL_BASED_OUTPATIENT_CLINIC_OR_DEPARTMENT_OTHER): Payer: Self-pay

## 2023-05-07 MED ORDER — FLULAVAL 0.5 ML IM SUSY
0.5000 mL | PREFILLED_SYRINGE | INTRAMUSCULAR | 0 refills | Status: DC
Start: 2023-05-07 — End: 2023-12-15
  Filled 2023-05-07: qty 0.5, 1d supply, fill #0

## 2023-05-07 MED ORDER — FLULAVAL 0.5 ML IM SUSY
0.5000 mL | PREFILLED_SYRINGE | Freq: Once | INTRAMUSCULAR | 0 refills | Status: AC
  Filled 2023-05-07: qty 0.5, 1d supply, fill #0

## 2023-08-15 ENCOUNTER — Other Ambulatory Visit: Payer: Self-pay | Admitting: Family

## 2023-08-15 DIAGNOSIS — F32A Depression, unspecified: Secondary | ICD-10-CM

## 2023-08-16 ENCOUNTER — Other Ambulatory Visit (HOSPITAL_BASED_OUTPATIENT_CLINIC_OR_DEPARTMENT_OTHER): Payer: Self-pay

## 2023-08-16 MED ORDER — CITALOPRAM HYDROBROMIDE 10 MG PO TABS
10.0000 mg | ORAL_TABLET | Freq: Every day | ORAL | 1 refills | Status: DC
  Filled 2023-08-16: qty 90, 90d supply, fill #0

## 2023-08-17 ENCOUNTER — Other Ambulatory Visit (HOSPITAL_BASED_OUTPATIENT_CLINIC_OR_DEPARTMENT_OTHER): Payer: Self-pay

## 2023-08-23 ENCOUNTER — Other Ambulatory Visit: Payer: Self-pay | Admitting: Family

## 2023-08-23 ENCOUNTER — Other Ambulatory Visit (HOSPITAL_BASED_OUTPATIENT_CLINIC_OR_DEPARTMENT_OTHER): Payer: Self-pay

## 2023-08-23 DIAGNOSIS — M79601 Pain in right arm: Secondary | ICD-10-CM

## 2023-08-23 MED ORDER — MELOXICAM 7.5 MG PO TABS
7.5000 mg | ORAL_TABLET | Freq: Every day | ORAL | 1 refills | Status: AC | PRN
  Filled 2023-08-23: qty 30, 30d supply, fill #0

## 2023-10-20 ENCOUNTER — Ambulatory Visit (INDEPENDENT_AMBULATORY_CARE_PROVIDER_SITE_OTHER): Payer: Self-pay | Admitting: Family

## 2023-10-20 ENCOUNTER — Other Ambulatory Visit (HOSPITAL_BASED_OUTPATIENT_CLINIC_OR_DEPARTMENT_OTHER): Payer: Self-pay

## 2023-10-20 VITALS — BP 118/67 | HR 84 | Temp 98.1°F | Resp 16 | Ht 70.0 in | Wt 227.0 lb

## 2023-10-20 DIAGNOSIS — E66811 Obesity, class 1: Secondary | ICD-10-CM

## 2023-10-20 DIAGNOSIS — H6122 Impacted cerumen, left ear: Secondary | ICD-10-CM | POA: Insufficient documentation

## 2023-10-20 DIAGNOSIS — F32A Depression, unspecified: Secondary | ICD-10-CM | POA: Diagnosis not present

## 2023-10-20 DIAGNOSIS — Z1231 Encounter for screening mammogram for malignant neoplasm of breast: Secondary | ICD-10-CM

## 2023-10-20 DIAGNOSIS — N951 Menopausal and female climacteric states: Secondary | ICD-10-CM | POA: Diagnosis not present

## 2023-10-20 MED ORDER — CITALOPRAM HYDROBROMIDE 20 MG PO TABS
20.0000 mg | ORAL_TABLET | Freq: Every day | ORAL | 3 refills | Status: AC
Start: 2023-10-20 — End: ?
  Filled 2023-10-20: qty 30, 30d supply, fill #0
  Filled 2023-11-24: qty 30, 30d supply, fill #1
  Filled 2023-12-27: qty 30, 30d supply, fill #2
  Filled 2024-01-26: qty 30, 30d supply, fill #3

## 2023-10-20 NOTE — Assessment & Plan Note (Signed)
 Uncontrolled. Not a candidate for HRT due to hx of heterozygous Factor V Leiden mutation.  Will see if increasing her citalopram  from 10 mg to 20 mg is helpful.

## 2023-10-20 NOTE — Patient Instructions (Signed)
 VISIT SUMMARY:  Today, we discussed your concerns about weight management and menopausal symptoms. We also addressed the sensation of fullness in your left ear.  YOUR PLAN:  MENOPAUSAL SYMPTOMS: You are experiencing persistent hot flashes and night sweats, and hormone replacement therapy is not an option due to your Factor V Leiden mutation. -Increase citalopram  to 20 mg daily to help manage your symptoms.  OBESITY: You have been struggling with weight management and have made some dietary and lifestyle changes. -Continue your current dietary habits and keep logging your meals. -Continue regular walking. -Resume yoga for physical activity.  LEFT EAR CERUMEN IMPACTION: You have a sensation of fullness in your left ear due to earwax buildup. -we flushed your ear to remove the wax.

## 2023-10-20 NOTE — Assessment & Plan Note (Signed)
 Mood and anxiety are stable on citalopram .

## 2023-10-20 NOTE — Assessment & Plan Note (Signed)
 After verbal consent, L ear flushed successfully by CMA. Pt tolerated procedure.

## 2023-10-20 NOTE — Progress Notes (Signed)
 Subjective:     Patient ID: Pamela [name], female    DOB: 08-May-1970, 53 y.o.   MRN: [id number]  Chief Complaint  Patient presents with   Weight Management Screening    Will like to talk about weight management    Ear Fullness    Patient reports left ear fullness     HPI  Discussed the use of AI scribe software for clinical note transcription with the patient, who gave verbal consent to proceed.  History of Present Illness  Pamela [name] Porchia is a 53 year old female who presents with concerns about weight management and menopausal symptoms.  She previously attended a weight loss clinic, losing 14 pounds but regaining most of it after stopping the program. She currently weighs 224 pounds and has stopped snacking but has not seen further weight loss.   She experiences persistent hot flashes and night sweats, feeling hot all the time. She keeps her home temperature at 70 degrees or lower to manage these symptoms, which have persisted for four to five years. She has tried  and Estroven without significant relief and is currently on citalopram , which helps with energy and stress levels.  She maintains a physically active lifestyle, walking for 30 minutes in the mornings and plans to return to yoga. She has made dietary changes, avoiding snacks and fried foods, and logs her meals to monitor intake.  She experiences a sensation of fullness in her left ear, which she attempts to clear by rinsing, suspecting wax buildup. Lab Results  Component Value Date   CHOL 170 11/13/2022   HDL 45.50 11/13/2022   LDLCALC 94 11/13/2022   TRIG 151.0 (H) 11/13/2022   CHOLHDL 4 11/13/2022       Health Maintenance Due  Topic Date Due   Pneumococcal Vaccine 19-30 Years old (1 of 2 - PCV) Never done   Hepatitis B Vaccines (1 of 3 - 19+ 3-dose series) Never done   MAMMOGRAM  10/09/2022   COVID-19 Vaccine (3 - 2024-25 season) 11/22/2022    Past Medical History:  Diagnosis Date    Anxiety    Adjustment disorder with anxious/depressed mood   DVT (deep venous thrombosis) (HCC)    Factor V Leiden (HCC)    Family history of anesthesia complication    Heartburn    Heterozygous factor V Leiden mutation (HCC)    History of blood clots    Infertility, female    Migraine headache    Mitral valve disorders(424.0)    with regurgitation   Mitral valve insufficiency and aortic valve insufficiency    Palpitations    PONV (postoperative nausea and vomiting)    Post-menopausal    Squamous cell carcinoma in situ    sees Pamela [name] on [employer]   Stomach ulcer    Vitamin D  deficiency    Von Willebrand disease (HCC)     Past Surgical History:  Procedure Laterality Date   MANDIBLE FRACTURE SURGERY     PAROTIDECTOMY Right 10/27/2013   superficial   on right           Dr Jesus   PAROTIDECTOMY Right 10/27/2013   Procedure: RIGHT SUPERFICIAL PAROTIDECTOMY;  Surgeon: Ida Jesus, MD;  Location: [facility] OR;  Service: ENT;  Laterality: Right;    Family History  Problem Relation Age of Onset   Thyroid  disease Mother    Mitral valve prolapse Mother    Hyperthyroidism Mother    Atrial fibrillation Mother    Heart disease Mother  Sleep apnea Mother    Sleep apnea Father    Diabetes Father    Pancreatic cancer Father    Diabetes Mellitus II Father    Dementia Father    Depression Father    Obesity Father    Mitral valve prolapse Brother    Cancer Maternal Grandmother        lung   Mitral valve prolapse Maternal Grandfather    Stroke Maternal Grandfather    Alzheimer's disease Paternal Grandfather     Social History   Socioeconomic History   Marital status: Single    Spouse name: Not on file   Number of children: Not on file   Years of education: college   Highest education level: Bachelor's degree (e.g., BA, AB, BS)  Occupational History   Occupation: Agricultural engineer: [employer]  Tobacco Use   Smoking status: Never   Smokeless tobacco: Never    Tobacco comments:    never used tobacco  Substance and Sexual Activity   Alcohol use: Not Currently    Alcohol/week: 2.0 - 3.0 standard drinks of alcohol    Types: 2 - 3 Standard drinks or equivalent per week   Drug use: No   Sexual activity: Not Currently    Partners: Male    Birth control/protection: None  Other Topics Concern   Not on file  Social History Narrative   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    Social Drivers of Corporate investment banker Strain: Low Risk  (10/19/2023)   Overall Financial Resource Strain (CARDIA)    Difficulty of Paying Living Expenses: Not hard at all  Food Insecurity: Unknown (10/19/2023)   Hunger Vital Sign    Worried About Running Out of Food in the Last Year: Not on file    Ran Out of Food in the Last Year: Never true  Transportation Needs: No Transportation Needs (10/19/2023)   PRAPARE - Administrator, Civil Service (Medical): No    Lack of Transportation (Non-Medical): No  Physical Activity: Sufficiently Active (10/19/2023)   Exercise Vital Sign    Days of Exercise per Week: 5 days    Minutes of Exercise per Session: 30 min  Stress: Stress Concern Present (10/19/2023)   [employer] of Occupational Health - Occupational Stress Questionnaire    Feeling of Stress: To some extent  Social Connections: Moderately Isolated (10/19/2023)   Social Connection and Isolation Panel    Frequency of Communication with Friends and Family: Three times a week    Frequency of Social Gatherings with Friends and Family: Once a week    Attends Religious Services: 1 to 4 times per year    Active Member of [employer] or Organizations: No    Attends Engineer, structural: Not on file    Marital Status: Divorced  Catering manager Violence: Not on file    Outpatient Medications Prior to  Visit  Medication Sig Dispense Refill   Ascorbic Acid (VITAMIN C) 1000 MG tablet Take 1,000 mg by mouth daily.     Cholecalciferol (VITAMIN D -3) 125 MCG (5000 UT) TABS Take 10,000 Int'l Units by mouth every Monday,Wednesday,Friday, and Sunday at 6 PM AND 5,000 Int'l Units every Tuesday, Thursday, and Saturday at 6 PM.     cyanocobalamin  (VITAMIN B12) 1000  MCG tablet Take 1,000 mcg by mouth daily.     influenza vac split trivalent PF (FLULAVAL ) 0.5 ML injection Inject 0.5 mLs into the muscle. 0.5 mL 0   influenza vac split trivalent PF (FLULAVAL ) 0.5 ML injection Inject 0.5 mLs into the muscle. 0.5 mL 0   meloxicam  (MOBIC ) 7.5 MG tablet Take 1 tablet (7.5 mg total) by mouth daily as needed for pain. 30 tablet 1   Multiple Minerals-Vitamins (CALCIUM-MAGNESIUM-ZINC -D3) TABS Take by mouth.     Multiple Vitamin (MULTI-VITAMIN) tablet Take 1 tablet by mouth daily.     polycarbophil (FIBER LAXATIVE) 625 MG tablet Take 625 mg by mouth daily.     SUMAtriptan  (IMITREX ) 50 MG tablet Take 1 tablet by mouth as directed. May repeat in 2 hours if headache persists or recurs. 10 tablet 5   citalopram  (CELEXA ) 10 MG tablet Take 1 tablet (10 mg total) by mouth daily. 90 tablet 1   No facility-administered medications prior to visit.    No Known Allergies  ROS See HPI    Objective:    Physical Exam Constitutional:      General: She is not in acute distress.    Appearance: Normal appearance. She is well-developed.  HENT:     Head: Normocephalic and atraumatic.     Right Ear: Tympanic membrane, ear canal and external ear normal.     Left Ear: External ear normal.     Ears:     Comments: Cerumen noted in L canal Eyes:     General: No scleral icterus. Neck:     Thyroid : No thyromegaly.  Cardiovascular:     Rate and Rhythm: Normal rate and regular rhythm.     Heart sounds: Normal heart sounds. No murmur heard. Pulmonary:     Effort: Pulmonary effort is normal. No respiratory distress.     Breath  sounds: Normal breath sounds. No wheezing.  Musculoskeletal:     Cervical back: Neck supple.  Skin:    General: Skin is warm and dry.  Neurological:     Mental Status: She is alert and oriented to person, place, and time.  Psychiatric:        Mood and Affect: Mood normal.        Behavior: Behavior normal.        Thought Content: Thought content normal.        Judgment: Judgment normal.      BP 118/67 (BP Location: Left Arm, Patient Position: Sitting, Cuff Size: Large)   Pulse 84   Temp 98.1 F (36.7 C) (Oral)   Resp 16   Ht 5' 10 (1.778 m)   Wt 227 lb (103 kg)   LMP 02/09/2015   SpO2 100%   BMI 32.57 kg/m  Wt Readings from Last 3 Encounters:  10/20/23 227 lb (103 kg)  01/19/23 210 lb (95.3 kg)  12/29/22 216 lb (98 kg)       Assessment & Plan:   Problem List Items Addressed This Visit       Unprioritized   Obesity (BMI 30.0-34.9)    BMI 32. Previous weight loss attempts unsustainable. Not a candidate for GLP-1 due to thyroid  cancer history.  - Encourage continuation of current dietary habits and logging meals. - Encourage resumption of yoga for physical activity. Continue regular walking.      Menopausal syndrome   Uncontrolled. Not a candidate for HRT due to hx of heterozygous Factor V Leiden mutation.  Will see if increasing her citalopram  from 10 mg  to 20 mg is helpful.       Relevant Medications   citalopram  (CELEXA ) 20 MG tablet   Depression - Primary   Mood and anxiety are stable on citalopram .       Relevant Medications   citalopram  (CELEXA ) 20 MG tablet   Ceruminosis, left   After verbal consent, L ear flushed successfully by CMA. Pt tolerated procedure.       Other Visit Diagnoses       Breast cancer screening by mammogram       Relevant Orders   MM 3D SCREENING MAMMOGRAM BILATERAL BREAST       I have discontinued Sabina E. Mellette Candy's citalopram . I am also having her start on citalopram . Additionally, I am having her maintain  her Multi-Vitamin, SUMAtriptan , vitamin C, cyanocobalamin , Calcium-Magnesium-Zinc -D3, Vitamin D -3, Fiber Laxative, Flulaval , Flulaval , and meloxicam .  Meds ordered this encounter  Medications   citalopram  (CELEXA ) 20 MG tablet    Sig: Take 1 tablet (20 mg total) by mouth daily.    Dispense:  30 tablet    Refill:  3    Supervising Provider:   DOMENICA BLACKBIRD A [[id number]]

## 2023-10-20 NOTE — Assessment & Plan Note (Signed)
  BMI 32. Previous weight loss attempts unsustainable. Not a candidate for GLP-1 due to thyroid  cancer history.  - Encourage continuation of current dietary habits and logging meals. - Encourage resumption of yoga for physical activity. Continue regular walking.

## 2023-12-08 ENCOUNTER — Encounter: Payer: Self-pay | Admitting: Family

## 2023-12-08 ENCOUNTER — Ambulatory Visit (INDEPENDENT_AMBULATORY_CARE_PROVIDER_SITE_OTHER): Admitting: Family

## 2023-12-08 VITALS — BP 106/70 | HR 70 | Temp 98.2°F | Resp 19 | Ht 70.0 in | Wt 227.0 lb

## 2023-12-08 DIAGNOSIS — E538 Deficiency of other specified B group vitamins: Secondary | ICD-10-CM | POA: Diagnosis not present

## 2023-12-08 DIAGNOSIS — Z23 Encounter for immunization: Secondary | ICD-10-CM

## 2023-12-08 DIAGNOSIS — E781 Pure hyperglyceridemia: Secondary | ICD-10-CM | POA: Diagnosis not present

## 2023-12-08 DIAGNOSIS — G43909 Migraine, unspecified, not intractable, without status migrainosus: Secondary | ICD-10-CM

## 2023-12-08 DIAGNOSIS — Z0001 Encounter for general adult medical examination with abnormal findings: Secondary | ICD-10-CM

## 2023-12-08 DIAGNOSIS — Z0000 Encounter for general adult medical examination without abnormal findings: Secondary | ICD-10-CM

## 2023-12-08 DIAGNOSIS — R400 Somnolence: Secondary | ICD-10-CM

## 2023-12-08 DIAGNOSIS — E559 Vitamin D deficiency, unspecified: Secondary | ICD-10-CM

## 2023-12-08 DIAGNOSIS — Z1231 Encounter for screening mammogram for malignant neoplasm of breast: Secondary | ICD-10-CM

## 2023-12-08 NOTE — Patient Instructions (Signed)
 VISIT SUMMARY:  Today, we discussed your frequent headaches and migraines, potential sleep apnea, and overall health maintenance. We also reviewed your progress with weight loss and management of depression and anxiety.  YOUR PLAN:  HEADACHES AND MIGRAINES: You have been experiencing almost daily headaches, some of which are migraines. -Consider undergoing a sleep study to evaluate for obstructive sleep apnea, which could be contributing to your headaches.  SUSPECTED OBSTRUCTIVE SLEEP APNEA: You have a family history and symptoms that suggest sleep apnea. -Consider a home sleep study to confirm the diagnosis. -If sleep apnea is confirmed, we can discuss treatment options such as CPAP or mandibular advancement devices.  DEPRESSION AND ANXIETY: Your depression and anxiety are well-managed with your current medication regimen. -Continue your current medication as it is effectively managing your symptoms.  OVERWEIGHT: You have started making lifestyle changes and have lost six pounds. -Continue focusing on reducing processed foods and aim for 10,000 steps daily.  HYPERTRIGLYCERIDEMIA: We need to check your lipid levels. -Get a lipid panel test to check your triglyceride levels.  VITAMIN B12 DEFICIENCY: Your B12 levels are low-normal. -Continue with your B12 supplementation and dietary changes. -Get your vitamin B12 levels checked.  ALLERGIC CONTACT DERMATITIS DUE TO POISON OAK: Your symptoms from poison oak exposure are resolving. -No further action needed at this time.  GENERAL HEALTH MAINTENANCE: You are due for routine vaccinations and screenings. -Get a flu shot. -Get the Prevnar 20 pneumonia vaccine. -Schedule a mammogram. -Schedule vision and dental exams.

## 2023-12-08 NOTE — Assessment & Plan Note (Signed)
  Due for routine vaccinations and screenings. Discussed importance of flu and pneumonia vaccinations. - Administer flu shot. - Administer Prevnar 20 pneumonia vaccine. - Order mammogram. - Encourage scheduling of vision and dental exams. - Continue healthy diet, exercise and weight loss efforts.

## 2023-12-08 NOTE — Progress Notes (Signed)
 Subjective:     Patient ID: Pamela [name], female    DOB: 1970-10-05, 53 y.o.   MRN: [id number]  Chief Complaint  Patient presents with   Annual Exam    HPI  Discussed the use of AI scribe software for clinical note transcription with the patient, who gave verbal consent to proceed.  History of Present Illness  Pamela [name] is a 53 year old female who presents with frequent headaches and migraines.  She experiences almost daily headaches, with some being migraines that start upon waking and are located on the top of her head. The pain can be severe, requiring her to lie back down after getting up. She manages the headaches with over-the-counter medications and lifestyle adjustments, including consuming a 'healthy' almond mocha for relief.  There is a family history of sleep apnea, but she has not undergone a sleep study. She does not snore and sleeps on her side. She experiences dry mouth, which has improved, and feels tired around 4-5 PM daily. Her morning routine includes immediate activity upon waking, which helps prevent headaches.  She is improving her diet and exercise routine, eliminating processed foods, increasing fresh salads, and using an app for guidance. She has lost six pounds and aims for 10,000 steps daily. She takes various supplements, including vitamins D, E, C, B, a multivitamin, fiber, probiotics, and occasionally calcium and magnesium.  Her medical history includes parotid surgery in 2015 and jaw surgery in 2020. She reports doing well with her depression and anxiety, noting significant improvement from previous years.  Immunizations:  flu shot today Diet: working on diet Exercise:  walking Colonoscopy: cologuard due 7/26 Pap Smear: 8/24  Mammogram: due Vision: due Dental: due      12/08/2023    3:01 PM 10/20/2023    3:02 PM 11/13/2022   10:46 AM 07/14/2022    5:15 PM 05/15/2022    3:53 PM  Depression screen PHQ 2/9  Decreased Interest  1 0 2 2 1   Down, Depressed, Hopeless 0 0 3 2 1   PHQ - 2 Score 1 0 5 4 2   Altered sleeping 1 0 1 1 2   Tired, decreased energy 2 0 3 3 1   Change in appetite 1 0 2 2 0  Feeling bad or failure about yourself  0 0 2 0 0  Trouble concentrating 0 0 1 2 2   Moving slowly or fidgety/restless 0 0 1 2 1   Suicidal thoughts 0 0 0 0 0  PHQ-9 Score 5 0 15 14 8   Difficult doing work/chores Not difficult at all Not difficult at all Somewhat difficult Somewhat difficult Somewhat difficult      Health Maintenance Due  Topic Date Due   Pneumococcal Vaccine: 50+ Years (1 of 2 - PCV) Never done   Hepatitis B Vaccines 19-59 Average Risk (1 of 3 - 19+ 3-dose series) Never done   Mammogram  10/09/2022   Influenza Vaccine  10/22/2023   COVID-19 Vaccine (3 - 2025-26 season) 11/22/2023    Past Medical History:  Diagnosis Date   Anxiety    Adjustment disorder with anxious/depressed mood   DVT (deep venous thrombosis) (HCC)    Factor V Leiden (HCC)    Family history of anesthesia complication    Heartburn    Heterozygous factor V Leiden mutation (HCC)    History of blood clots    Infertility, female    Migraine headache    Mitral valve disorders(424.0)    with regurgitation  Mitral valve insufficiency and aortic valve insufficiency    Palpitations    PONV (postoperative nausea and vomiting)    Post-menopausal    Squamous cell carcinoma in situ    sees Elvie Bair on [employer]   Stomach ulcer    Vitamin D  deficiency    Von Willebrand disease (HCC)     Past Surgical History:  Procedure Laterality Date   MANDIBLE FRACTURE SURGERY     PAROTIDECTOMY Right 10/27/2013   superficial   on right           Dr Jesus   PAROTIDECTOMY Right 10/27/2013   Procedure: RIGHT SUPERFICIAL PAROTIDECTOMY;  Surgeon: Ida Jesus, MD;  Location: [facility] OR;  Service: ENT;  Laterality: Right;    Family History  Problem Relation Age of Onset   Thyroid  disease Mother    Mitral valve prolapse Mother     Hyperthyroidism Mother    Atrial fibrillation Mother    Heart disease Mother    Sleep apnea Mother    Sleep apnea Father    Diabetes Father    Pancreatic cancer Father    Diabetes Mellitus II Father    Dementia Father    Depression Father    Obesity Father    Mitral valve prolapse Brother    Cancer Maternal Grandmother        lung   Mitral valve prolapse Maternal Grandfather    Stroke Maternal Grandfather    Alzheimer's disease Paternal Grandfather     Social History   Socioeconomic History   Marital status: Single    Spouse name: Not on file   Number of children: Not on file   Years of education: college   Highest education level: Bachelor's degree (e.g., BA, AB, BS)  Occupational History   Occupation: Agricultural engineer: [employer]  Tobacco Use   Smoking status: Never   Smokeless tobacco: Never   Tobacco comments:    never used tobacco  Substance and Sexual Activity   Alcohol use: Not Currently    Alcohol/week: 2.0 - 3.0 standard drinks of alcohol    Types: 2 - 3 Standard drinks or equivalent per week   Drug use: No   Sexual activity: Not Currently    Partners: Male    Birth control/protection: None  Other Topics Concern   Not on file  Social History Narrative   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    Social Drivers of Corporate investment banker Strain: Low Risk  (10/19/2023)   Overall Financial Resource Strain (CARDIA)    Difficulty of Paying Living Expenses: Not hard at all  Food Insecurity: Unknown (10/19/2023)   Hunger Vital Sign    Worried About Running Out of Food in the Last Year: Not on file    Ran Out of Food in the Last Year: Never true  Transportation Needs: No Transportation Needs (10/19/2023)   PRAPARE - Administrator, Civil Service (Medical): No    Lack of  Transportation (Non-Medical): No  Physical Activity: Sufficiently Active (10/19/2023)   Exercise Vital Sign    Days of Exercise per Week: 5 days    Minutes of Exercise per Session: 30 min  Stress: Stress Concern Present (10/19/2023)   [employer] of Occupational Health - Occupational Stress Questionnaire  Feeling of Stress: To some extent  Social Connections: Moderately Isolated (10/19/2023)   Social Connection and Isolation Panel    Frequency of Communication with Friends and Family: Three times a week    Frequency of Social Gatherings with Friends and Family: Once a week    Attends Religious Services: 1 to 4 times per year    Active Member of [employer] or Organizations: No    Attends Engineer, structural: Not on file    Marital Status: Divorced  Catering manager Violence: Not on file    Outpatient Medications Prior to Visit  Medication Sig Dispense Refill   Ascorbic Acid (VITAMIN C) 1000 MG tablet Take 1,000 mg by mouth daily.     Cholecalciferol (VITAMIN D -3) 125 MCG (5000 UT) TABS Take 10,000 Int'l Units by mouth every Monday,Wednesday,Friday, and Sunday at 6 PM AND 5,000 Int'l Units every Tuesday, Thursday, and Saturday at 6 PM.     citalopram  (CELEXA ) 20 MG tablet Take 1 tablet (20 mg total) by mouth daily. 30 tablet 3   cyanocobalamin  (VITAMIN B12) 1000 MCG tablet Take 1,000 mcg by mouth daily.     influenza vac split trivalent PF (FLULAVAL ) 0.5 ML injection Inject 0.5 mLs into the muscle. 0.5 mL 0   influenza vac split trivalent PF (FLULAVAL ) 0.5 ML injection Inject 0.5 mLs into the muscle. 0.5 mL 0   meloxicam  (MOBIC ) 7.5 MG tablet Take 1 tablet (7.5 mg total) by mouth daily as needed for pain. 30 tablet 1   Multiple Minerals-Vitamins (CALCIUM-MAGNESIUM-ZINC -D3) TABS Take by mouth.     Multiple Vitamin (MULTI-VITAMIN) tablet Take 1 tablet by mouth daily.     polycarbophil (FIBER LAXATIVE) 625 MG tablet Take 625 mg by mouth daily.     SUMAtriptan  (IMITREX ) 50 MG  tablet Take 1 tablet by mouth as directed. May repeat in 2 hours if headache persists or recurs. 10 tablet 5   No facility-administered medications prior to visit.    No Known Allergies  Review of Systems  Constitutional:  Positive for weight loss.  HENT:  Negative for congestion and hearing loss.   Eyes:  Negative for blurred vision.  Respiratory:  Negative for cough.   Cardiovascular:  Negative for leg swelling.  Gastrointestinal:  Negative for constipation and diarrhea.  Genitourinary:  Negative for dysuria and frequency.  Musculoskeletal:  Negative for joint pain and myalgias.  Skin:  Negative for rash.  Neurological:  Positive for headaches.  Psychiatric/Behavioral:  Negative for depression. The patient is not nervous/anxious.        Objective:    Physical Exam   BP 106/70 (BP Location: Right Arm, Patient Position: Sitting, Cuff Size: Large)   Pulse 70   Temp 98.2 F (36.8 C) (Oral)   Resp 19   Ht 5' 10 (1.778 m)   Wt 227 lb (103 kg)   LMP 02/09/2015   SpO2 97%   BMI 32.57 kg/m  Wt Readings from Last 3 Encounters:  12/08/23 227 lb (103 kg)  10/20/23 227 lb (103 kg)  01/19/23 210 lb (95.3 kg)  Physical Exam  Constitutional: She is oriented to person, place, and time. She appears well-developed and well-nourished. No distress.  HENT:  Head: Normocephalic and atraumatic.  Right Ear: Tympanic membrane and ear canal normal.  Left Ear: Tympanic membrane and ear canal normal.  Mouth/Throat: Oropharynx is clear and moist.  Eyes: Pupils are equal, round, and reactive to light. No scleral icterus.  Neck: Normal range of motion. No thyromegaly present.  Cardiovascular: Normal rate and regular rhythm.   No murmur heard. Pulmonary/Chest: Effort normal and breath sounds normal. No respiratory distress. He has no wheezes. She has no rales. She exhibits no tenderness.  Abdominal: Soft. Bowel sounds are normal. She exhibits no distension and no mass. There is no tenderness.  There is no rebound and no guarding.  Musculoskeletal: She exhibits no edema.  Lymphadenopathy:    She has no cervical adenopathy.  Neurological: She is alert and oriented to person, place, and time. She has normal patellar reflexes. She exhibits normal muscle tone. Coordination normal.  Skin: Skin is warm and dry.  Psychiatric: She has a normal mood and affect. Her behavior is normal. Judgment and thought content normal.  Breast/Pelvic: deferred          Assessment & Plan:        Assessment & Plan:   Problem List Items Addressed This Visit       Unprioritized   Preventative health care    Due for routine vaccinations and screenings. Discussed importance of flu and pneumonia vaccinations. - Administer flu shot. - Administer Prevnar 20 pneumonia vaccine. - Order mammogram. - Encourage scheduling of vision and dental exams. - Continue healthy diet, exercise and weight loss efforts.      Daytime somnolence - Primary   Daytime somnolence, daily headaches- both parents have sleep apnea.  Family history and symptoms suggest sleep apnea. Discussed risks of untreated sleep apnea. She is hesitant about CPAP, open to alternatives. - Encourage home sleep study to confirm diagnosis. - Discuss treatment options including CPAP and mandibular advancement devices if confirmed.       Relevant Orders   Ambulatory referral to Pulmonology   Other Visit Diagnoses       Breast cancer screening by mammogram       Relevant Orders   MM 3D SCREENING MAMMOGRAM BILATERAL BREAST     B12 deficiency       Relevant Orders   B12     Vitamin D  deficiency       Relevant Orders   Vitamin D  (25 hydroxy)     Hypertriglyceridemia       Relevant Orders   Lipid panel   Comp Met (CMET)       I am having Pamela [name] maintain her Multi-Vitamin, SUMAtriptan , vitamin C, cyanocobalamin , Calcium-Magnesium-Zinc -D3, Vitamin D -3, Fiber Laxative, Flulaval , Flulaval , meloxicam , and  citalopram .  No orders of the defined types were placed in this encounter.

## 2023-12-08 NOTE — Assessment & Plan Note (Signed)
 Suspect OSA contributing- recommend sleep study. Referral placed. Continue imitex prn.

## 2023-12-08 NOTE — Addendum Note (Signed)
 Addended by: WELLS LEVORN HERO on: 12/08/2023 04:02 PM   Modules accepted: Orders

## 2023-12-08 NOTE — Assessment & Plan Note (Signed)
 Daytime somnolence, daily headaches- both parents have sleep apnea.  Family history and symptoms suggest sleep apnea. Discussed risks of untreated sleep apnea. She is hesitant about CPAP, open to alternatives. - Encourage home sleep study to confirm diagnosis. - Discuss treatment options including CPAP and mandibular advancement devices if confirmed.

## 2023-12-09 ENCOUNTER — Ambulatory Visit: Payer: Self-pay | Admitting: Family

## 2023-12-09 LAB — COMPREHENSIVE METABOLIC PANEL WITH GFR
ALT: 21 U/L (ref 0–35)
AST: 24 U/L (ref 0–37)
Albumin: 4.4 g/dL (ref 3.5–5.2)
Alkaline Phosphatase: 72 U/L (ref 39–117)
BUN: 21 mg/dL (ref 6–23)
CO2: 28 meq/L (ref 19–32)
Calcium: 9.6 mg/dL (ref 8.4–10.5)
Chloride: 102 meq/L (ref 96–112)
Creatinine, Ser: 0.96 mg/dL (ref 0.40–1.20)
GFR: 66.67 mL/min (ref >60.00)
Glucose, Bld: 85 mg/dL (ref 70–99)
Potassium: 4.4 meq/L (ref 3.5–5.1)
Sodium: 137 meq/L (ref 135–145)
Total Bilirubin: 0.8 mg/dL (ref 0.2–1.2)
Total Protein: 7.3 g/dL (ref 6.0–8.3)

## 2023-12-09 LAB — LIPID PANEL
Cholesterol: 160 mg/dL (ref 0–200)
HDL: 51.3 mg/dL (ref >39.00)
LDL Cholesterol: 86 mg/dL (ref 0–99)
NonHDL: 108.74
Total CHOL/HDL Ratio: 3
Triglycerides: 115 mg/dL (ref 0.0–149.0)
VLDL: 23 mg/dL (ref 0.0–40.0)

## 2023-12-09 LAB — VITAMIN B12: Vitamin B-12: 243 pg/mL (ref 211–911)

## 2023-12-09 LAB — VITAMIN D 25 HYDROXY (VIT D DEFICIENCY, FRACTURES): VITD: 34.55 ng/mL (ref 30.00–100.00)

## 2023-12-15 ENCOUNTER — Encounter: Payer: Self-pay | Admitting: Adult Health

## 2023-12-15 ENCOUNTER — Ambulatory Visit: Admitting: Adult Health

## 2023-12-15 VITALS — BP 108/76 | HR 78 | Temp 97.9°F | Ht 70.0 in | Wt 228.4 lb

## 2023-12-15 DIAGNOSIS — R400 Somnolence: Secondary | ICD-10-CM | POA: Diagnosis not present

## 2023-12-15 DIAGNOSIS — R519 Headache, unspecified: Secondary | ICD-10-CM | POA: Diagnosis not present

## 2023-12-15 DIAGNOSIS — G478 Other sleep disorders: Secondary | ICD-10-CM | POA: Diagnosis not present

## 2023-12-15 DIAGNOSIS — G8929 Other chronic pain: Secondary | ICD-10-CM | POA: Diagnosis not present

## 2023-12-15 DIAGNOSIS — Z6832 Body mass index (BMI) 32.0-32.9, adult: Secondary | ICD-10-CM

## 2023-12-15 NOTE — Patient Instructions (Signed)
 Set up for home sleep study  Work on healthy weight  Do not drive if sleepy  Follow up with in 6 weeks and As needed

## 2023-12-15 NOTE — Progress Notes (Signed)
 @Patient  ID: Pamela [name], female    DOB: 1970-08-29, 53 y.o.   MRN: [id number]  Chief Complaint  Patient presents with   Consult    Sleep consult    Referring provider: Daryl Setter, NP  HPI: 53 yo female seen for sleep consult for daytime sleepiness and headaches  TEST/EVENTS :   12/15/2023 Sleep consult  Discussed the use of AI scribe software for clinical note transcription with the patient, who gave verbal consent to proceed.    History of Present Illness Pamela [name] is a 53 year old female who presents with sleep disturbances and daily headaches. She was referred by her primary care provider for evaluation of possible sleep apnea.  She has experienced headaches since childhood, which have recently worsened, with daily headaches upon waking for the past couple of months.  She experiences significant sleep disturbances, describing her bed as her 'enemy' and feeling tired all the time. She typically goes to bed around 9 or 10 PM, taking up to 30 minutes to fall asleep, and wakes up between 4 and 7 AM. She often wakes up feeling unrefreshed and tired, and sometimes wakes up as early as 2 AM and cannot return to sleep. No use of sleep aids and denies snoring, though she sleeps with her mouth open. She reports being tired during the day, especially around 4 or 5 PM, and occasionally takes long naps on weekends.  She has a history of a patent foramen ovale (PFO) and mitral valve issues, similar to her mother and brother, but has not required surgical intervention. . She also has Factor V Leiden, which is present in her father and brother, though her brother is asymptomatic. Additionally, she has acquired Von Willebrand's disease and is under the care of a hematologist.  She is currently taking citalopram  for menopause symptoms. She avoids caffeine at home, consuming only decaf coffee and tea, and limits caffeine intake to one soda or coffee per day at work.  Works daytime shift, office work.   Her family history includes her mother having sleep apnea and using a CPAP machine, though she eventually stopped using it. Her mother also had thyroid  cancer, and she herself has a thyroid  nodule that is being monitored. She has a family history of thyroid  issues and is concerned about weight gain.  Does nap on occasion up to 3-4 hr at times. History of TMJ. No removeable dental work. No history of CHF or CVA. Does not use sleep aides  Epworth is 4 out 24, Gets sleepy watching TV and in the evening.   Past Surgical History:  Procedure Laterality Date   MANDIBLE FRACTURE SURGERY     PAROTIDECTOMY Right 10/27/2013   superficial   on right           Dr Jesus   PAROTIDECTOMY Right 10/27/2013   Procedure: RIGHT SUPERFICIAL PAROTIDECTOMY;  Surgeon: Ida Jesus, MD;  Location: [facility] OR;  Service: ENT;  Laterality: Right;     No Known Allergies  Immunization History  Administered Date(s) Administered   Influenza, Seasonal, Injecte, Preservative Fre 05/07/2023, 12/08/2023   Influenza,inj,Quad PF,6+ Mos 05/01/2014, 05/15/2022   PFIZER(Purple Top)SARS-COV-2 Vaccination 08/10/2019, 09/04/2019   PNEUMOCOCCAL CONJUGATE-20 12/08/2023   PPD Test 10/20/2010   Tdap 09/10/2011, 10/12/2018   Zoster Recombinant(Shingrix ) 09/30/2021, 11/21/2021    Past Medical History:  Diagnosis Date   Anxiety    Adjustment disorder with anxious/depressed mood   DVT (deep venous thrombosis) (HCC)    Factor V Leiden  Family history of anesthesia complication    Heartburn    Heterozygous factor V Leiden mutation    History of blood clots    Infertility, female    Migraine headache    Mitral valve disorders(424.0)    with regurgitation   Mitral valve insufficiency and aortic valve insufficiency    Palpitations    PONV (postoperative nausea and vomiting)    Post-menopausal    Squamous cell carcinoma in situ    sees Elvie Bair on [employer]   Stomach ulcer    Vitamin D   deficiency    Von Willebrand disease (HCC)     Tobacco History: Social History   Tobacco Use  Smoking Status Never   Passive exposure: Never  Smokeless Tobacco Never  Tobacco Comments   never used tobacco   Counseling given: Not Answered Tobacco comments: never used tobacco   Outpatient Medications Prior to Visit  Medication Sig Dispense Refill   Ascorbic Acid (VITAMIN C) 1000 MG tablet Take 1,000 mg by mouth daily.     Cholecalciferol (VITAMIN D -3) 125 MCG (5000 UT) TABS Take 10,000 Int'l Units by mouth every Monday,Wednesday,Friday, and Sunday at 6 PM AND 5,000 Int'l Units every Tuesday, Thursday, and Saturday at 6 PM.     citalopram  (CELEXA ) 20 MG tablet Take 1 tablet (20 mg total) by mouth daily. 30 tablet 3   cyanocobalamin  (VITAMIN B12) 1000 MCG tablet Take 1,000 mcg by mouth daily.     meloxicam  (MOBIC ) 7.5 MG tablet Take 1 tablet (7.5 mg total) by mouth daily as needed for pain. 30 tablet 1   Multiple Minerals-Vitamins (CALCIUM-MAGNESIUM-ZINC -D3) TABS Take by mouth.     Multiple Vitamin (MULTI-VITAMIN) tablet Take 1 tablet by mouth daily.     polycarbophil (FIBER LAXATIVE) 625 MG tablet Take 625 mg by mouth daily.     SUMAtriptan  (IMITREX ) 50 MG tablet Take 1 tablet by mouth as directed. May repeat in 2 hours if headache persists or recurs. 10 tablet 5   vitamin E 1000 UNIT capsule Take 1,000 Units by mouth daily.     influenza vac split trivalent PF (FLULAVAL ) 0.5 ML injection Inject 0.5 mLs into the muscle. 0.5 mL 0   influenza vac split trivalent PF (FLULAVAL ) 0.5 ML injection Inject 0.5 mLs into the muscle. 0.5 mL 0   No facility-administered medications prior to visit.     Review of Systems:   Constitutional:   No  weight loss, night sweats,  Fevers, chills, +fatigue, or  lassitude.  HEENT:   No  Difficulty swallowing,  Tooth/dental problems, or  Sore throat,                No sneezing, itching, ear ache, nasal congestion, post nasal drip,   CV:  No chest  pain,  Orthopnea, PND, swelling in lower extremities, anasarca, dizziness, palpitations, syncope.   GI  No heartburn, indigestion, abdominal pain, nausea, vomiting, diarrhea, change in bowel habits, loss of appetite, bloody stools.   Resp: No shortness of breath with exertion or at rest.  No excess mucus, no productive cough,  No non-productive cough,  No coughing up of blood.  No change in color of mucus.  No wheezing.  No chest wall deformity  Skin: no rash or lesions.  GU: no dysuria, change in color of urine, no urgency or frequency.  No flank pain, no hematuria   MS:  No joint pain or swelling.  No decreased range of motion.  No back pain.    Physical  Exam  BP 108/76   Pulse 78   Temp 97.9 F (36.6 C) (Oral)   Ht 5' 10 (1.778 m)   Wt 228 lb 6.4 oz (103.6 kg)   LMP 02/09/2015   SpO2 97%   BMI 32.77 kg/m   GEN: A/Ox3; pleasant , NAD, well nourished    HEENT:  [place]/AT,    NOSE-clear, THROAT-clear, no lesions, no postnasal drip or exudate noted. Class 2-3 MP airway   NECK:  Supple w/ fair ROM; no JVD; normal carotid impulses w/o bruits; no thyromegaly or nodules palpated; no lymphadenopathy.    RESP  Clear  P & A; w/o, wheezes/ rales/ or rhonchi. no accessory muscle use, no dullness to percussion  CARD:  RRR, no m/r/g, no peripheral edema, pulses intact, no cyanosis or clubbing.  GI:   Soft & nt; nml bowel sounds; no organomegaly or masses detected.   Musco: Warm bil, no deformities or joint swelling noted.   Neuro: alert, no focal deficits noted.    Skin: Warm, no lesions or rashes    Lab Results:  CBC    Component Value Date/Time   WBC 4.2 12/08/2022 0942   WBC 5.2 09/30/2021 1548   RBC 4.74 12/08/2022 0942   RBC 4.63 09/30/2021 1548   HGB 13.3 12/08/2022 0942   HGB 12.9 10/13/2013 1021   HCT 41.4 12/08/2022 0942   HCT 37.4 10/13/2013 1021   PLT 202 12/08/2022 0942   MCV 87 12/08/2022 0942   MCV 86 10/13/2013 1021   MCH 28.1 12/08/2022 0942   MCH 28.3  07/30/2014 1114   MCHC 32.1 12/08/2022 0942   MCHC 33.9 09/30/2021 1548   RDW 12.9 12/08/2022 0942   RDW 12.3 10/13/2013 1021   LYMPHSABS 1.0 12/08/2022 0942   LYMPHSABS 1.0 10/13/2013 1021   MONOABS 0.4 09/30/2021 1548   EOSABS 0.1 12/08/2022 0942   EOSABS 0.2 10/13/2013 1021   BASOSABS 0.0 12/08/2022 0942   BASOSABS 0.0 10/13/2013 1021    BMET    Component Value Date/Time   NA 137 12/08/2023 1540   NA 138 12/08/2022 0942   K 4.4 12/08/2023 1540   CL 102 12/08/2023 1540   CO2 28 12/08/2023 1540   GLUCOSE 85 12/08/2023 1540   BUN 21 12/08/2023 1540   BUN 17 12/08/2022 0942   CREATININE 0.96 12/08/2023 1540   CREATININE 0.64 07/30/2014 1114   CALCIUM 9.6 12/08/2023 1540   GFRNONAA >90 10/20/2013 1048   GFRNONAA >89 09/10/2011 1136   GFRAA >90 10/20/2013 1048   GFRAA >89 09/10/2011 1136    BNP No results found for: BNP  ProBNP No results found for: PROBNP  Imaging: No results found.  Administration History     None           No data to display          No results found for: NITRICOXIDE      Assessment & Plan:   No problem-specific Assessment & Plan notes found for this encounter.  Assessment and Plan Assessment & Plan Suspected sleep apnea   She presents with symptoms suggestive of sleep apnea, including morning headaches, non-restorative sleep, and daytime fatigue. She reports open mouth breathing during sleep and has a family history of sleep apnea, but no personal history of snoring. The differential diagnosis includes sleep apnea, which may contribute to her symptoms. Discussed potential long-term effects of untreated sleep apnea, such as increased risk for cardiovascular issues, diabetes, and systemic effects due to hypoxemia. Order a  home sleep study through [facility]. Schedule a follow-up appointment in six weeks to review sleep study results. Discuss treatment options if sleep apnea is confirmed. Evaluate for alternative causes  of symptoms if sleep apnea is not confirmed.  BMI 32- healthy weight loss.   Chronic Headaches- continue follow up with PCP . Sleep study pending.   Plan  Patient Instructions  Set up for home sleep study  Work on healthy weight  Do not drive if sleepy  Follow up with in 6 weeks and As needed          Madelin Stank, NP 12/15/2023

## 2023-12-28 ENCOUNTER — Inpatient Hospital Stay (HOSPITAL_BASED_OUTPATIENT_CLINIC_OR_DEPARTMENT_OTHER): Admission: RE | Admit: 2023-12-28 | Source: Ambulatory Visit

## 2023-12-28 ENCOUNTER — Encounter

## 2023-12-28 ENCOUNTER — Encounter (HOSPITAL_BASED_OUTPATIENT_CLINIC_OR_DEPARTMENT_OTHER): Payer: Self-pay

## 2023-12-28 ENCOUNTER — Ambulatory Visit (HOSPITAL_BASED_OUTPATIENT_CLINIC_OR_DEPARTMENT_OTHER)
Admission: RE | Admit: 2023-12-28 | Discharge: 2023-12-28 | Disposition: A | Discharge status: Home / Self Care | Source: Ambulatory Visit | Attending: Family | Admitting: Family

## 2023-12-28 DIAGNOSIS — R400 Somnolence: Secondary | ICD-10-CM

## 2023-12-28 DIAGNOSIS — Z1231 Encounter for screening mammogram for malignant neoplasm of breast: Secondary | ICD-10-CM | POA: Diagnosis not present

## 2024-01-12 DIAGNOSIS — G4733 Obstructive sleep apnea (adult) (pediatric): Secondary | ICD-10-CM | POA: Diagnosis not present

## 2024-01-25 ENCOUNTER — Telehealth: Payer: Self-pay

## 2024-01-25 NOTE — Telephone Encounter (Signed)
 ATC and LVM. Pt has appt with TP on 01/26/24 to go over sleep study results, but does not look like sleep study was completed. If study was not done, pt has no reason to come to appt.  Sending MyChart message as well.

## 2024-01-26 ENCOUNTER — Ambulatory Visit (INDEPENDENT_AMBULATORY_CARE_PROVIDER_SITE_OTHER): Admitting: Adult Health

## 2024-01-26 ENCOUNTER — Other Ambulatory Visit (HOSPITAL_BASED_OUTPATIENT_CLINIC_OR_DEPARTMENT_OTHER): Payer: Self-pay

## 2024-01-26 ENCOUNTER — Encounter: Payer: Self-pay | Admitting: Adult Health

## 2024-01-26 VITALS — BP 118/86 | HR 65 | Temp 97.9°F | Ht 70.0 in | Wt 230.4 lb

## 2024-01-26 DIAGNOSIS — Z6833 Body mass index (BMI) 33.0-33.9, adult: Secondary | ICD-10-CM

## 2024-01-26 DIAGNOSIS — G4733 Obstructive sleep apnea (adult) (pediatric): Secondary | ICD-10-CM

## 2024-01-26 MED ORDER — ZEPBOUND 2.5 MG/0.5ML ~~LOC~~ SOAJ
2.5000 mg | SUBCUTANEOUS | 0 refills | Status: DC
  Filled 2024-01-26: qty 2, 28d supply, fill #0

## 2024-01-26 NOTE — Telephone Encounter (Signed)
 Patient currently in a visit with TP, sleep study will be addressed during visit.

## 2024-01-26 NOTE — Progress Notes (Signed)
 @Patient  ID: Pamela [name], female    DOB: [birth year], 53 y.o.   MRN: [id number]  Chief Complaint  Patient presents with   Follow-up    HST f/u    Referring provider: Daryl Setter, NP  HPI: 53 year old female seen for sleep consult December 15, 2023 for daytime sleepiness found to have moderate obstructive sleep apnea    TEST/EVENTS : Reviewed 01/26/2024  Discussed the use of AI scribe software for clinical note transcription with the patient, who gave verbal consent to proceed.  History of Present Illness Pamela [name] is a 53 year old female with sleep apnea who presents with significant daytime sleepiness and fatigue.  She experiences significant daytime sleepiness, tiredness, fatigue,. She was set up for home sleep study.  Her sleep study showed 28.2 episodes per hour. Oxygen levels dropped to 82% during sleep. She sleeps on her side and uses two pillows to keep her head elevated, which she believes helps her condition. Despite these measures, she continues to feel restless, wakes up tired, and experiences headaches, low energy, and weight gain. She has tried using a CPAP machine for three nights but found it uncomfortable.  She has a history of TMJ, which makes the use of a dental device for sleep apnea unsuitable. She also reports having experienced a sinus infection recently, for which she has been taking Mucinex, which has alleviated her ear pain.  She is currently on citalopram , which she believes causes dry mouth. She has a history of blood clots and factor V Leiden mutation, for which she previously took Lovenox injections. She avoids hormonal treatments due to her history of blood clots.  She maintains a healthy diet, focusing on salads and vegetables, and avoids bread. She engages in regular physical activity, including walking, weight training, and yoga. She is in menopause and experiences hot flashes, which are managed with citalopram . She  complains that no matter what she does not can lose weight .     No Known Allergies  Immunization History  Administered Date(s) Administered   Influenza, Seasonal, Injecte, Preservative Fre 05/07/2023, 12/08/2023   Influenza,inj,Quad PF,6+ Mos 05/01/2014, 05/15/2022   PFIZER(Purple Top)SARS-COV-2 Vaccination 08/10/2019, 09/04/2019   PNEUMOCOCCAL CONJUGATE-20 12/08/2023   PPD Test 10/20/2010   Tdap 09/10/2011, 10/12/2018   Zoster Recombinant(Shingrix ) 09/30/2021, 11/21/2021    Past Medical History:  Diagnosis Date   Anxiety    Adjustment disorder with anxious/depressed mood   DVT (deep venous thrombosis) (HCC)    Factor V Leiden    Family history of anesthesia complication    Heartburn    Heterozygous factor V Leiden mutation    History of blood clots    Infertility, female    Migraine headache    Mitral valve disorders(424.0)    with regurgitation   Mitral valve insufficiency and aortic valve insufficiency    Palpitations    PONV (postoperative nausea and vomiting)    Post-menopausal    Squamous cell carcinoma in situ    sees Elvie Bair on [employer]   Stomach ulcer    Vitamin D  deficiency    Von Willebrand disease (HCC)     Tobacco History: Social History   Tobacco Use  Smoking Status Never   Passive exposure: Never  Smokeless Tobacco Never  Tobacco Comments   never used tobacco   Counseling given: Not Answered Tobacco comments: never used tobacco   Outpatient Medications Prior to Visit  Medication Sig Dispense Refill   Ascorbic Acid (VITAMIN C) 1000 MG tablet Take  1,000 mg by mouth daily.     Cholecalciferol (VITAMIN D -3) 125 MCG (5000 UT) TABS Take 10,000 Int'l Units by mouth every Monday,Wednesday,Friday, and Sunday at 6 PM AND 5,000 Int'l Units every Tuesday, Thursday, and Saturday at 6 PM. (Patient taking differently: Take 10,000 Int'l Units by mouth every Monday,Wednesday,Friday, and Sunday at 6 PM AND 5,000 Int'l Units every Tuesday, Thursday, and  Saturday at 6 PM.)     citalopram  (CELEXA ) 20 MG tablet Take 1 tablet (20 mg total) by mouth daily. 30 tablet 3   cyanocobalamin  (VITAMIN B12) 1000 MCG tablet Take 1,000 mcg by mouth daily.     meloxicam  (MOBIC ) 7.5 MG tablet Take 1 tablet (7.5 mg total) by mouth daily as needed for pain. 30 tablet 1   Multiple Minerals-Vitamins (CALCIUM-MAGNESIUM-ZINC -D3) TABS Take by mouth.     Multiple Vitamin (MULTI-VITAMIN) tablet Take 1 tablet by mouth daily.     polycarbophil (FIBER LAXATIVE) 625 MG tablet Take 625 mg by mouth daily.     SUMAtriptan  (IMITREX ) 50 MG tablet Take 1 tablet by mouth as directed. May repeat in 2 hours if headache persists or recurs. (Patient taking differently: Take 1 tablet by mouth as directed. May repeat in 2 hours if headache persists or recurs.) 10 tablet 5   vitamin E 1000 UNIT capsule Take 1,000 Units by mouth daily.     No facility-administered medications prior to visit.     Review of Systems:   Constitutional:   No  weight loss, night sweats,  Fevers, chills, +fatigue, or  lassitude.  HEENT:   No headaches,  Difficulty swallowing,  Tooth/dental problems, or  Sore throat,                No sneezing, itching, ear ache, nasal congestion, post nasal drip,   CV:  No chest pain,  Orthopnea, PND, swelling in lower extremities, anasarca, dizziness, palpitations, syncope.   GI  No heartburn, indigestion, abdominal pain, nausea, vomiting, diarrhea, change in bowel habits, loss of appetite, bloody stools.   Resp: No shortness of breath with exertion or at rest.  No excess mucus, no productive cough,  No non-productive cough,  No coughing up of blood.  No change in color of mucus.  No wheezing.  No chest wall deformity  Skin: no rash or lesions.  GU: no dysuria, change in color of urine, no urgency or frequency.  No flank pain, no hematuria   MS:  No joint pain or swelling.  No decreased range of motion.  No back pain.    Physical Exam  BP 118/86   Pulse 65    Temp 97.9 F (36.6 C)   Ht 5' 10 (1.778 m) Comment: per pt  Wt 230 lb 6.4 oz (104.5 kg)   LMP 02/09/2015   SpO2 97% Comment: RA  BMI 33.06 kg/m   GEN: A/Ox3; pleasant , NAD, well nourished    HEENT:  [place]/AT,   NOSE-clear, THROAT-clear, no lesions, no postnasal drip or exudate noted.   NECK:  Supple w/ fair ROM; no JVD; normal carotid impulses w/o bruits; no thyromegaly or nodules palpated; no lymphadenopathy.    RESP  Clear  P & A; w/o, wheezes/ rales/ or rhonchi. no accessory muscle use, no dullness to percussion  CARD:  RRR, no m/r/g, no peripheral edema, pulses intact, no cyanosis or clubbing.  GI:   Soft & nt; nml bowel sounds; no organomegaly or masses detected.   Musco: Warm bil, no deformities or joint swelling noted.  Neuro: alert, no focal deficits noted.    Skin: Warm, no lesions or rashes    Lab Results:Reviewed 01/26/2024   CBC  No results found for: BNP  ProBNP No results found for: PROBNP  Imaging:   Administration History     None           No data to display          No results found for: NITRICOXIDE     12/15/2023   10:00 AM  Results of the Epworth flowsheet  Sitting and reading 1  Watching TV 1  Sitting, inactive in a public place (e.g. a theatre or a meeting) 0  As a passenger in a car for an hour without a break 0  Lying down to rest in the afternoon when circumstances permit 2  Sitting and talking to someone 0  Sitting quietly after a lunch without alcohol 0  In a car, while stopped for a few minutes in traffic 0  Total score 4        Assessment & Plan:   Assessment and Plan Assessment & Plan Obstructive sleep apnea, moderate  She has moderate obstructive sleep apnea with an AHI of 28.2 and oxygen desaturation to 82%, with significant symptoms burden with daytime sleepiness, fatigue. Discussed treatment options, including weight loss and CPAP therapy.  . Order a CPAP machine with a nasal mask, instruct on its  use and maintenance, and recommend saline spray and nasal gel for nasal congestion. Discuss CPAP desensitization techniques. Schedule a follow-up in three months to assess CPAP efficacy. Begin Zepbound weekly injection  - discussed how weight can impact sleep and risk for sleep disordered breathing - discussed options to assist with weight loss: combination of diet modification, cardiovascular and strength training exercises   - had an extensive discussion regarding the adverse health consequences related to untreated sleep disordered breathing - specifically discussed the risks for hypertension, coronary artery disease, cardiac dysrhythmias, cerebrovascular disease, and diabetes - lifestyle modification discussed   - discussed how sleep disruption can increase risk of accidents, particularly when driving - safe driving practices were discussed    Patient has moderate  sleep apnea related to obesity with BMI >/30  posing significant cardiovascular risks. Patient has failed traditional weight loss measures with caloric deficit and consistent exercise of 150 min/week for >/6 months. Patient will be initiated on Zepbound (tirzepatide) for weight management. Zepbound is the only pharmaceutical treatment approved for moderate-to-severe OSA in adults who are overweight (BMI >/27) or obese (BMI >/30). The patient will continue lifestyle modifications, including structured nutrition and physical activity as directed. No other GLP1 therapy will be used simultaneously at this time. The patient does not have any FDA labeled contraindications to this agent, including pregnancy, lactation, hx or family history of medullary thyroid  cancer, or multiple endocrine neoplasia type II. Side effect profile has been reviewed with patient. Aware of red flag symptoms to notify of immediately or seek emergency care, including severe nausea/vomiting, inability to pass bowels or gas, severe abdominal pain/tenderness, jaundice.     Obesity -BMI 33   We discussed dietary modifications and exercise. Zepbound, a GLP-1 receptor agonist, was introduced for weight loss and sleep apnea management. Discussed side effects include nausea, vomiting, and diarrhea. Emphasize diet, exercise, hydration, and a balanced diet to manage side effects and support weight loss. Insurance approval and potential prior authorization for Zepbound were discussed. Prescribe Zepbound 2.5 mg weekly and schedule a virtual follow-up in four weeks to assess  its efficacy. Monitor for side effects and adjust treatment as needed. Encourage diet and exercise to support weight loss.           Madelin Stank, NP 01/26/2024

## 2024-01-26 NOTE — Telephone Encounter (Signed)
 ATCx2 LVMTCB to reschedule appt for 01/26/24 with TP if she has not had sleep study done.

## 2024-01-26 NOTE — Patient Instructions (Addendum)
 Begin CPAP At bedtime, wear all night long  Work on healthy weight loss  Do not drive if sleepy Saline nasal spray Twice daily   Saline nasal gel At bedtime    Begin Zepbound 2.5mg  injection weekly.  Healthy weight loss and exercise.  Goal is for of exercise daily.  Follow up in 4 weeks and As needed  -[facility]    Notify your provider if you are planning to have a procedure/surgery, as this medication will need to be stopped prior.

## 2024-01-27 ENCOUNTER — Other Ambulatory Visit (HOSPITAL_BASED_OUTPATIENT_CLINIC_OR_DEPARTMENT_OTHER): Payer: Self-pay

## 2024-02-16 NOTE — Telephone Encounter (Signed)
 Pamela [name], I advised the patient you are currently out of the office and will return next week.  Please advise regarding sending the prescription per her request.  Thank you.

## 2024-02-22 ENCOUNTER — Telehealth: Payer: Self-pay

## 2024-02-22 MED ORDER — TIRZEPATIDE-WEIGHT MANAGEMENT 2.5 MG/0.5ML ~~LOC~~ SOLN: 2.5000 mg | SUBCUTANEOUS | 0 refills | Status: DC

## 2024-02-22 NOTE — Telephone Encounter (Signed)
 Per Madelin Stank, NP, placed Zepbound  coupon up front for patient in an envelope labeled with her name and DOB. Pt has been made aware. NFN.

## 2024-02-22 NOTE — Telephone Encounter (Signed)
 Patient aware, will come p/up coupon for first free month. Left at front desk.  Will sched ov in 4 weeks for follow up

## 2024-02-22 NOTE — Telephone Encounter (Signed)
 Rx for [employer] sent. LMOMTCB to discuss process.

## 2024-02-24 NOTE — Telephone Encounter (Signed)
 Copied from CRM #[id number]. Topic: Clinical - Medication Question >> Feb 22, 2024  2:00 PM Abigail D wrote: Reason for CRM: Patient returning call from [facility] regarding the [employer]. Attempted CAL 2x but unavailable. Please call back when able.  Duplicate.

## 2024-03-01 ENCOUNTER — Other Ambulatory Visit: Payer: Self-pay | Admitting: Family

## 2024-03-01 DIAGNOSIS — F32A Depression, unspecified: Secondary | ICD-10-CM

## 2024-03-01 DIAGNOSIS — N951 Menopausal and female climacteric states: Secondary | ICD-10-CM

## 2024-03-02 ENCOUNTER — Other Ambulatory Visit (HOSPITAL_BASED_OUTPATIENT_CLINIC_OR_DEPARTMENT_OTHER): Payer: Self-pay

## 2024-03-02 MED ORDER — CITALOPRAM HYDROBROMIDE 20 MG PO TABS
20.0000 mg | ORAL_TABLET | Freq: Every day | ORAL | 0 refills | Status: AC
  Filled 2024-03-02: qty 90, 90d supply, fill #0

## 2024-03-03 ENCOUNTER — Ambulatory Visit: Admitting: Adult Health

## 2024-03-03 DIAGNOSIS — G4733 Obstructive sleep apnea (adult) (pediatric): Secondary | ICD-10-CM | POA: Diagnosis not present

## 2024-03-09 ENCOUNTER — Telehealth: Payer: Self-pay

## 2024-03-09 NOTE — Telephone Encounter (Signed)
 ATC LVMTCB and sent MyChart message asking if pt has been able to receive her CPAP machine.

## 2024-03-10 ENCOUNTER — Encounter: Payer: Self-pay | Admitting: Adult Health

## 2024-03-10 ENCOUNTER — Telehealth: Admitting: Adult Health

## 2024-03-10 DIAGNOSIS — G4733 Obstructive sleep apnea (adult) (pediatric): Secondary | ICD-10-CM

## 2024-03-10 MED ORDER — TIRZEPATIDE-WEIGHT MANAGEMENT 5 MG/0.5ML ~~LOC~~ SOLN: 5.0000 mg | SUBCUTANEOUS | 0 refills | Status: DC

## 2024-03-10 NOTE — Progress Notes (Signed)
 Virtual Visit via Video Note  I connected with Pamela [name] on 03/10/2024 at  2:30 PM EST by a video enabled telemedicine application and verified that I am speaking with the correct person using two identifiers.  Location: Patient: Home  Provider: Office    I discussed the limitations of evaluation and management by telemedicine and the availability of in person appointments. The patient expressed understanding and agreed to proceed.  History of Present Illness: 53 year old female seen for sleep consult September 2025 for daytime sleepiness found to have moderate obstructive sleep apnea  Today's video visit is a 6-week follow-up.  Patient had presented initially with daytime sleepiness and fatigue.  Was set up for home sleep study that showed moderate obstructive sleep apnea with AHI 28.2/hour and SpO2 low at 82%.  Patient was started on CPAP therapy last visit.  Patient also has been struggling with weight loss.  Last visit weight was at 230 pounds with a BMI of 33.  She was started on Zepbound  therapy approved for sleep apnea management with associated obesity.  Since last visit patient is doing well.  Patient just received her CPAP machine 1 week ago.  Says that she is starting get used to it she is working with her homecare company on a new mask.  Initially started on a fullface mask.  She is receiving a modified fullface mask today feels like that this will be more comfortable.  She is slowly increasing her nightly usage.  Feels that it is starting to work but is still having some trouble getting used to the pressures.  She is currently on CPAP AutoSet.  Patient was started on Zepbound  last visit.  She is going through the [employer] direct self-pay program.  She was able to get her first month free.  She started on 2.5 mg weekly.  She has taken 2 doses and is going to be doing her third injection in a few days.  She says the first week she has some stomach upset was able to manage it pretty  well.  This week had no side effects or GI symptoms.  She has not had significant weight loss yet but is very excited about the potential.  We discussed healthy balanced diet along with exercise.  We discussed increasing her dose to 5 mg in 2 weeks prescription was sent to [employer] direct self-pay program.   Observations/Objective: Appears well no acute distress  Assessment and Plan: Moderate obstructive sleep apnea.  Recent home sleep study showed newly diagnosed sleep apnea.  Patient has been started on CPAP therapy.  She just received her new CPAP machine.  Patient is continue on CPAP each night tried to wear for 6 hours or more.  Patient education given on CPAP care and ongoing usage.  Follow-up in 6 to 8 weeks in person or virtual  Morbid obesity.-Patient is struggled with weight loss.  Started on Zepbound  last visit.  Patient education given on Zepbound .  Patient seems to be tolerating well.  Went over red flag GI symptoms.  Increase to 5 mg weekly injection in 2 weeks.   Plan  Patient Instructions  Continue on  CPAP At bedtime, wear all night long  Work on healthy weight loss  Do not drive if sleepy Saline nasal spray Twice daily   Saline nasal gel At bedtime    Continue on  Zepbound  2.5mg  injection weekly for next 2 weeks, then 5mg  weekly.   Healthy weight loss and exercise.  Goal is for  of exercise daily.  Follow up in 8  weeks and As needed  -Virtual or In person    Notify your provider if you are planning to have a procedure/surgery, as this medication will need to be stopped prior.         Follow Up Instructions:    I discussed the assessment and treatment plan with the patient. The patient was provided an opportunity to ask questions and all were answered. The patient agreed with the plan and demonstrated an understanding of the instructions.   The patient was advised to call back or seek an in-person evaluation if the symptoms worsen or if the condition fails to  improve as anticipated.  I provided 30 minutes of non-face-to-face time during this encounter.   Madelin Stank, NP

## 2024-03-10 NOTE — Patient Instructions (Signed)
 Continue on  CPAP At bedtime, wear all night long  Work on healthy weight loss  Do not drive if sleepy Saline nasal spray Twice daily   Saline nasal gel At bedtime    Continue on  Zepbound  2.5mg  injection weekly for next 2 weeks, then 5mg  weekly.   Healthy weight loss and exercise.  Goal is for of exercise daily.  Follow up in 8  weeks and As needed  -Virtual or In person    Notify your provider if you are planning to have a procedure/surgery, as this medication will need to be stopped prior.

## 2024-03-14 ENCOUNTER — Other Ambulatory Visit: Payer: Self-pay | Admitting: Adult Health

## 2024-04-06 ENCOUNTER — Other Ambulatory Visit: Payer: Self-pay | Admitting: Adult Health

## 2024-04-21 MED ORDER — ZEPBOUND 7.5 MG/0.5ML ~~LOC~~ SOLN: 7.5000 mg | SUBCUTANEOUS | 0 refills | Status: AC

## 2024-04-21 NOTE — Telephone Encounter (Signed)
 Tammy, please advise Zepbound  request. I wasn't sure if you wanted to do a dose change or not.

## 2024-04-21 NOTE — Telephone Encounter (Signed)
 Copied from CRM #[id number]. Topic: Clinical - Medication Question >> Apr 19, 2024 12:48 PM Pamela [name] wrote: Reason for CRM: pt called requesting her prescription for her tirzepatide  (ZEPBOUND ) 2.5 MG/0.5ML injection vial. Stated she missed her appt and the next one is not until 02/23. Stated she has missed. Stated she is having issues with the CPAP machine. Stated she had a nose bleed the other day, just that one time.  Tammy, please see patient MyChart note to you.  Thank you.

## 2024-04-28 ENCOUNTER — Other Ambulatory Visit (HOSPITAL_BASED_OUTPATIENT_CLINIC_OR_DEPARTMENT_OTHER): Payer: Self-pay

## 2024-04-28 ENCOUNTER — Other Ambulatory Visit: Payer: Self-pay

## 2024-04-28 ENCOUNTER — Ambulatory Visit: Admitting: Podiatry

## 2024-04-28 MED ORDER — GABAPENTIN 300 MG PO CAPS
300.0000 mg | ORAL_CAPSULE | Freq: Three times a day (TID) | ORAL | 3 refills | Status: AC
  Filled 2024-04-28: qty 90, 30d supply, fill #0

## 2024-04-28 NOTE — Progress Notes (Unsigned)
 Burning shooting tingling to the lateral 3 toes likely due to shoes.  Encouraged shoe gear modification and over-the-counter options for orthotics if no improvement we will discuss other options gabapentin  sent

## 2024-05-15 ENCOUNTER — Ambulatory Visit: Admitting: Adult Health

## 2024-06-06 ENCOUNTER — Ambulatory Visit: Admitting: Family

## 2024-12-12 ENCOUNTER — Encounter: Admitting: Family
